# Patient Record
Sex: Female | Born: 1955
Health system: Southern US, Community
[De-identification: ages and names within clinical notes are randomized; demographics above are authoritative.]

## PROBLEM LIST (undated history)

## (undated) DIAGNOSIS — K219 Gastro-esophageal reflux disease without esophagitis: Secondary | ICD-10-CM

## (undated) DIAGNOSIS — F419 Anxiety disorder, unspecified: Secondary | ICD-10-CM

## (undated) DIAGNOSIS — E118 Type 2 diabetes mellitus with unspecified complications: Secondary | ICD-10-CM

## (undated) DIAGNOSIS — F32A Depression, unspecified: Secondary | ICD-10-CM

## (undated) DIAGNOSIS — M87051 Idiopathic aseptic necrosis of right femur: Secondary | ICD-10-CM

## (undated) DIAGNOSIS — M87059 Idiopathic aseptic necrosis of unspecified femur: Secondary | ICD-10-CM

## (undated) DIAGNOSIS — E782 Mixed hyperlipidemia: Secondary | ICD-10-CM

## (undated) DIAGNOSIS — I10 Essential (primary) hypertension: Secondary | ICD-10-CM

## (undated) DIAGNOSIS — J45909 Unspecified asthma, uncomplicated: Secondary | ICD-10-CM

## (undated) DIAGNOSIS — E559 Vitamin D deficiency, unspecified: Secondary | ICD-10-CM

## (undated) DIAGNOSIS — F329 Major depressive disorder, single episode, unspecified: Secondary | ICD-10-CM

## (undated) HISTORY — PX: INCONTINENCE SURGERY: SHX676

## (undated) HISTORY — DX: Vitamin D deficiency, unspecified: E55.9

## (undated) HISTORY — DX: Mixed hyperlipidemia: E78.2

## (undated) HISTORY — DX: Gastro-esophageal reflux disease without esophagitis: K21.9

## (undated) HISTORY — DX: Type 2 diabetes mellitus with unspecified complications: E11.8

## (undated) HISTORY — PX: SHOULDER SURGERY: SHX246

## (undated) HISTORY — DX: Idiopathic aseptic necrosis of unspecified femur: M87.059

## (undated) HISTORY — DX: Idiopathic aseptic necrosis of right femur: M87.051

## (undated) HISTORY — PX: FOOT SURGERY: SHX648

## (undated) HISTORY — PX: TONSILLECTOMY: SUR1361

## (undated) HISTORY — DX: Anxiety disorder, unspecified: F41.9

## (undated) HISTORY — DX: Depression, unspecified: F32.A

## (undated) HISTORY — DX: Major depressive disorder, single episode, unspecified: F32.9

---

## 1997-11-03 ENCOUNTER — Ambulatory Visit (HOSPITAL_COMMUNITY): Admission: RE | Admit: 1997-11-03 | Discharge: 1997-11-03 | Payer: Self-pay | Admitting: Family Medicine

## 1999-11-07 ENCOUNTER — Ambulatory Visit (HOSPITAL_COMMUNITY): Admission: RE | Admit: 1999-11-07 | Discharge: 1999-11-07 | Payer: Self-pay | Admitting: Family Medicine

## 1999-11-07 ENCOUNTER — Encounter: Payer: Self-pay | Admitting: Family Medicine

## 2001-01-22 ENCOUNTER — Encounter: Payer: Self-pay | Admitting: Family Medicine

## 2001-01-22 ENCOUNTER — Ambulatory Visit (HOSPITAL_COMMUNITY): Admission: RE | Admit: 2001-01-22 | Discharge: 2001-01-22 | Payer: Self-pay | Admitting: Family Medicine

## 2002-02-03 ENCOUNTER — Ambulatory Visit (HOSPITAL_COMMUNITY): Admission: RE | Admit: 2002-02-03 | Discharge: 2002-02-03 | Payer: Self-pay | Admitting: Obstetrics & Gynecology

## 2002-02-03 ENCOUNTER — Encounter: Payer: Self-pay | Admitting: Family Medicine

## 2003-02-08 ENCOUNTER — Encounter: Payer: Self-pay | Admitting: Family Medicine

## 2003-02-08 ENCOUNTER — Ambulatory Visit (HOSPITAL_COMMUNITY): Admission: RE | Admit: 2003-02-08 | Discharge: 2003-02-08 | Payer: Self-pay | Admitting: Family Medicine

## 2006-01-27 ENCOUNTER — Ambulatory Visit (HOSPITAL_COMMUNITY): Admission: RE | Admit: 2006-01-27 | Discharge: 2006-01-27 | Payer: Self-pay | Admitting: Obstetrics & Gynecology

## 2006-06-30 ENCOUNTER — Ambulatory Visit (HOSPITAL_COMMUNITY): Admission: RE | Admit: 2006-06-30 | Discharge: 2006-07-01 | Payer: Self-pay | Admitting: Urology

## 2007-01-29 ENCOUNTER — Ambulatory Visit (HOSPITAL_COMMUNITY): Admission: RE | Admit: 2007-01-29 | Discharge: 2007-01-29 | Payer: Self-pay | Admitting: Family Medicine

## 2007-07-16 ENCOUNTER — Ambulatory Visit (HOSPITAL_BASED_OUTPATIENT_CLINIC_OR_DEPARTMENT_OTHER): Admission: RE | Admit: 2007-07-16 | Discharge: 2007-07-16 | Payer: Self-pay | Admitting: Orthopedic Surgery

## 2007-11-10 ENCOUNTER — Other Ambulatory Visit: Admission: RE | Admit: 2007-11-10 | Discharge: 2007-11-10 | Payer: Self-pay | Admitting: Family Medicine

## 2010-01-30 ENCOUNTER — Emergency Department (HOSPITAL_COMMUNITY): Admission: EM | Admit: 2010-01-30 | Discharge: 2010-01-30 | Payer: Self-pay | Admitting: Emergency Medicine

## 2010-05-12 ENCOUNTER — Encounter: Payer: Self-pay | Admitting: Family Medicine

## 2010-05-13 ENCOUNTER — Encounter: Payer: Self-pay | Admitting: Family Medicine

## 2010-07-05 LAB — BASIC METABOLIC PANEL
BUN: 8 mg/dL (ref 6–23)
CO2: 27 mEq/L (ref 19–32)
Chloride: 103 mEq/L (ref 96–112)
GFR calc Af Amer: >60 mL/min (ref >60)
GFR calc non Af Amer: >60 mL/min (ref >60)
Potassium: 3.7 mEq/L (ref 3.5–5.1)
Sodium: 138 mEq/L (ref 135–145)

## 2010-07-05 LAB — CBC: Hemoglobin: 13 g/dL (ref 12.0–15.0)

## 2010-09-04 NOTE — Op Note (Signed)
[name], [name]                ACCOUNT NO.:  [id number]   MEDICAL RECORD NO.:  [id number]          PATIENT TYPE:  AMB   LOCATION:  DSC                          FACILITY:  [facility]   PHYSICIAN:  Feliberto Gottron. Turner Daniels, M.D.   DATE OF BIRTH:  [birth year]   DATE OF PROCEDURE:  07/16/2007  DATE OF DISCHARGE:                               OPERATIVE REPORT   PREOPERATIVE DIAGNOSIS:  Left shoulder impingement syndrome,  acromioclavicular joint arthritis, possible rotator cuff tear.   POSTOPERATIVE DIAGNOSIS:  Left shoulder impingement syndrome,  acromioclavicular joint arthritis with subclavicular spur and a massive  rotator cuff tear.   PROCEDURE:  Left shoulder arthroscopic anterior-inferior acromioplasty,  distal clavicle spur excision, and debridement of massive rotator cuff  tear.   SURGEON:  Feliberto Gottron.  Turner Daniels, M.D.   FIRST ASSISTANT:  Skip Mayer, P.A.-C.   ANESTHETIC:  Interscalene block right and general endotracheal.   FLUID REPLACEMENT:  800 mL of crystalloid.   DRAINS PLACED:  None.   TOURNIQUET TIME:  None.   INDICATIONS FOR PROCEDURE:  A 55 year old woman with fairly impressive  right shoulder impingement syndrome, type 2 subacromial spur who has  failed conservative treatment and desires elective arthroscopic  evaluation and treatment of her right shoulder.  She is a two pack a day  smoker, is mildly overweight, and is aware that if she has a repairable  rotator cuff tear we will do that; if she has a massive cuff tear  especially in light of the tobacco use we will not attempt repair it for  two reasons:  One, it cannot be repaired, and secondly, her healing  potential is minimized by the age and tobacco use.  Risks and benefits  of surgery discussed, questions answered.   DESCRIPTION OF PROCEDURE:  The patient identified by armband, taken to  the block area at [facility] day surgery center, and right interscalene  block was induced.  She was then taken to operating Room  1, appropriate  anesthetic monitors were attached, and general endotracheal anesthesia  induced and then placed in the beach-chair position  __________  positioner.  Right upper extremity prepped and draped in usual sterile  fashion from the wrist to the hemithorax.  Using a #11 blade, standard  portals were then made 1.5-cm anterior to the [facility] joint, lateral to the  junction of middle and posterior thirds of the acromion, and posterior  to the posterolateral corner of the acromion process.  Inflow was placed  anteriorly, the arthroscope laterally, and a 4.2 [place]  shaver posteriorly.  Subacromial bursectomy was accomplished revealing a  massive rotator cuff tear of the supraspinatus tendon, and we debrided  it back to stable tissue, back to the glenoid rim.  We made a  supplemental anterolateral portal to put a grasper into the shoulder,  and we were only able to get the edge of the rotator cuff to within  about a centimeter and half of the medial border of the greater  tuberosity making any sort of repair pretty much unlikely.  We then  debrided the anterior and  posterior edges back to a stable margin, and  it pretty much involved most of the supraspinatus tendon.  The biceps  tendon and biceps anchor were intact.  The labrum was intact.  I  directed our attention to the subacromial region.  We identified  subclavicular and subacromial spurs, and using a 4.5 hooded vortex bur  created a type 1 subacromial arch removing the subclavicular and  subacromial spurs without difficulty.  Small bleeders were cauterized  with the hook Bovie.  The shoulder was then irrigated out with normal  saline solution and the arthroscopic instruments removed.  A dressing of  Xeroform, 4x4 dressing, sponges, paper tape, and a sling applied.  The  patient was laid supine, awakened, and taken to the recovery room  without difficulty.      Feliberto Gottron. Turner Daniels, M.D.  Electronically Signed      FJR/MEDQ  D:  07/16/2007  T:  07/17/2007  Job:  [id number]

## 2010-09-07 NOTE — Op Note (Signed)
[name], [name]                ACCOUNT NO.:  [id number]   MEDICAL RECORD NO.:  [id number]          PATIENT TYPE:  AMB   LOCATION:  DAY                          FACILITY:  [facility]   PHYSICIAN:  Maretta Bees. Vonita Moss, M.D.DATE OF BIRTH:  1956/04/07   DATE OF PROCEDURE:  06/30/2006  DATE OF DISCHARGE:                               OPERATIVE REPORT   PREOPERATIVE DIAGNOSIS:  Stress urinary incontinence.   POSTOP DIAGNOSIS:  Stress urinary incontinence.   PROCEDURE:  Obturator sling insertion and cystoscopy.   SURGEON:  Dr. Larey Dresser   ANESTHESIA:  Spinal   INDICATIONS:  This lady has had significant stress urinary incontinence  for 3 years.  She seemed to have minimal urgency incontinence.  On  examination she has very mild cystocele and no rectocele.   Urodynamics showed a stable bladder that was somewhat high capacity and  only had a 2-3 cm descensus on fluoroscopic exam.  She was felt to be a  satisfactory candidate for obturator sling insertion and has a slightly  increased risk of urinary retention, but still probably low at 2-3%.  She was advised about the recovery time an overnight stay and risk of  erosion, infection, retention or failure to correct her incontinence.  She is also advised about risk of bladder injury.   PROCEDURE:  The patient brought to the operating room and placed in  lithotomy position after induction of spinal anesthesia.  She was  prepped and draped in the usual fashion including vaginal canal.  She  seemed to have a good deal of pelvic relaxation and certainly larger  cystocele than urodynamics or clinical exam indicated. I called Dr.  McDiarmid who said that pelvic floor relaxation can be exaggerated under  spinal as I suspected.  He advised just making sure the sling was placed  in mid urethra and did not advocate cystocele repair at this time.  Also  make sure the sling was not too tight.   Foley catheter was inserted and the bladder  drained. The suburethral  vaginal wall was infiltrated with Xylocaine with epi.  A mid urethral  midline incision was made suburethrally and dissection was carried  periurethrally to the endopelvic fascia on each side.  I should add  before I made the vaginal incision I used a marker to indicate the site  of the Foley balloon when it was pulled on traction so I knew where the  proximal urethra was to place a sling in mid urethra. Stab wounds were  made over the obturator fossa area bilaterally at the same height as the  clitoris.  The curved needle passers were placed through the stab wound  and marched along the back of the obturator bone to exit the endopelvic  fascia lateral to the urethra with no button holing.  With both needles  in good position the obturator sling was pulled into position and then I  cystoscoped her. There was no evidence of bladder or urethral injury.  The sling was then placed in mid urethra with a hemostat easily fitting  between the sling and the urethra.  There was not felt to be any excess  tension and the sling was mid urethra.  Excess sling material was cut  off at the stab wound entry sites.  Wounds were irrigated with  antibiotic solution.  The vaginal incision was closed with running 2-0  Vicryl and after the cystoscopy the Foley catheter was back in position.  The stab wounds were dressed with Dermabond.  An Estrace cream  impregnated pack was placed in the vagina.  Foley catheter was connected  to closed drainage.  The patient was taken to recovery room in good  condition having tolerated the procedure well.      Maretta Bees. Vonita Moss, M.D.  Electronically Signed     LJP/MEDQ  D:  06/30/2006  T:  06/30/2006  Job:  [id number]

## 2011-01-14 LAB — BASIC METABOLIC PANEL
BUN: 11
CO2: 25
Calcium: 9.5
Chloride: 95 — ABNORMAL LOW
Creatinine, Ser: 0.47
Potassium: 4.8

## 2011-01-14 LAB — POCT HEMOGLOBIN-HEMACUE: Hemoglobin: 11.3 — ABNORMAL LOW

## 2011-12-20 ENCOUNTER — Emergency Department (HOSPITAL_COMMUNITY)
Admission: EM | Admit: 2011-12-20 | Discharge: 2011-12-20 | Disposition: A | Discharge status: Home / Self Care | Payer: Self-pay | Attending: Emergency Medicine | Admitting: Emergency Medicine

## 2011-12-20 ENCOUNTER — Emergency Department (HOSPITAL_COMMUNITY): Payer: Self-pay

## 2011-12-20 ENCOUNTER — Encounter (HOSPITAL_COMMUNITY): Payer: Self-pay | Admitting: *Deleted

## 2011-12-20 DIAGNOSIS — K5732 Diverticulitis of large intestine without perforation or abscess without bleeding: Secondary | ICD-10-CM | POA: Insufficient documentation

## 2011-12-20 DIAGNOSIS — F329 Major depressive disorder, single episode, unspecified: Secondary | ICD-10-CM | POA: Insufficient documentation

## 2011-12-20 DIAGNOSIS — F3289 Other specified depressive episodes: Secondary | ICD-10-CM | POA: Insufficient documentation

## 2011-12-20 DIAGNOSIS — J45909 Unspecified asthma, uncomplicated: Secondary | ICD-10-CM | POA: Insufficient documentation

## 2011-12-20 DIAGNOSIS — F411 Generalized anxiety disorder: Secondary | ICD-10-CM | POA: Insufficient documentation

## 2011-12-20 DIAGNOSIS — I10 Essential (primary) hypertension: Secondary | ICD-10-CM | POA: Insufficient documentation

## 2011-12-20 DIAGNOSIS — E119 Type 2 diabetes mellitus without complications: Secondary | ICD-10-CM | POA: Insufficient documentation

## 2011-12-20 DIAGNOSIS — F17200 Nicotine dependence, unspecified, uncomplicated: Secondary | ICD-10-CM | POA: Insufficient documentation

## 2011-12-20 DIAGNOSIS — R109 Unspecified abdominal pain: Secondary | ICD-10-CM | POA: Insufficient documentation

## 2011-12-20 DIAGNOSIS — Z79899 Other long term (current) drug therapy: Secondary | ICD-10-CM | POA: Insufficient documentation

## 2011-12-20 HISTORY — DX: Unspecified asthma, uncomplicated: J45.909

## 2011-12-20 HISTORY — DX: Essential (primary) hypertension: I10

## 2011-12-20 LAB — CBC WITH DIFFERENTIAL/PLATELET
Basophils Absolute: 0 10*3/uL (ref 0.0–0.1)
Basophils Relative: 0 % (ref 0–1)
Eosinophils Absolute: 0.2 10*3/uL (ref 0.0–0.7)
Eosinophils Relative: 2 % (ref 0–5)
HCT: 34.1 % — ABNORMAL LOW (ref 36.0–46.0)
Hemoglobin: 11.8 g/dL — ABNORMAL LOW (ref 12.0–15.0)
Lymphocytes Relative: 21 % (ref 12–46)
MCHC: 34.6 g/dL (ref 30.0–36.0)
MCV: 86.1 fL (ref 78.0–100.0)
Neutrophils Relative %: 68 % (ref 43–77)
Platelets: 245 10*3/uL (ref 150–400)
RBC: 3.96 MIL/uL (ref 3.87–5.11)
RDW: 12.5 % (ref 11.5–15.5)
WBC: 10.3 10*3/uL (ref 4.0–10.5)

## 2011-12-20 LAB — COMPREHENSIVE METABOLIC PANEL
ALT: 18 U/L (ref 0–35)
Albumin: 3.9 g/dL (ref 3.5–5.2)
Alkaline Phosphatase: 76 U/L (ref 39–117)
CO2: 25 mEq/L (ref 19–32)
GFR calc Af Amer: >90 mL/min (ref >90)
Glucose, Bld: 109 mg/dL — ABNORMAL HIGH (ref 70–99)
Sodium: 135 mEq/L (ref 135–145)
Total Protein: 7.1 g/dL (ref 6.0–8.3)

## 2011-12-20 LAB — URINALYSIS, ROUTINE W REFLEX MICROSCOPIC
Specific Gravity, Urine: 1.009 (ref 1.005–1.030)
pH: 6.5 (ref 5.0–8.0)

## 2011-12-20 MED ORDER — IOHEXOL 300 MG/ML  SOLN
100.0000 mL | Freq: Once | INTRAMUSCULAR | Status: AC | PRN
  Administered 2011-12-20: 100 mL via INTRAVENOUS

## 2011-12-20 MED ORDER — ONDANSETRON HCL 4 MG/2ML IJ SOLN
4.0000 mg | Freq: Once | INTRAMUSCULAR | Status: AC
  Administered 2011-12-20: 4 mg via INTRAVENOUS
  Filled 2011-12-20: qty 2

## 2011-12-20 MED ORDER — ONDANSETRON 4 MG PO TBDP: 4.0000 mg | ORAL_TABLET | Freq: Once | ORAL | Status: DC

## 2011-12-20 MED ORDER — SODIUM CHLORIDE 0.9 % IV SOLN
INTRAVENOUS | Status: DC
  Administered 2011-12-20: 08:00:00 via INTRAVENOUS

## 2011-12-20 MED ORDER — IBUPROFEN 800 MG PO TABS: 800.0000 mg | ORAL_TABLET | Freq: Once | ORAL | Status: DC

## 2011-12-20 MED ORDER — TRAMADOL HCL 50 MG PO TABS: 50.0000 mg | ORAL_TABLET | Freq: Four times a day (QID) | ORAL | Status: AC | PRN

## 2011-12-20 MED ORDER — METRONIDAZOLE 500 MG PO TABS: 500.0000 mg | ORAL_TABLET | Freq: Three times a day (TID) | ORAL | Status: AC

## 2011-12-20 MED ORDER — CIPROFLOXACIN HCL 500 MG PO TABS: 500.0000 mg | ORAL_TABLET | Freq: Two times a day (BID) | ORAL | Status: AC

## 2011-12-20 MED ORDER — SODIUM CHLORIDE 0.9 % IV BOLUS (SEPSIS)
500.0000 mL | Freq: Once | INTRAVENOUS | Status: AC
  Administered 2011-12-20: 500 mL via INTRAVENOUS

## 2011-12-20 NOTE — ED Provider Notes (Signed)
History     CSN: [id number]  Arrival date & time 12/20/11  4098   First MD Initiated Contact with Patient 12/20/11 0710      Chief Complaint  Patient presents with  . Abdominal Pain    (Consider location/radiation/quality/duration/timing/severity/associated sxs/prior treatment) HPI Pt is a 56 yo female complaining of 5 days of suprapubic abdominal pain and urinary frequency. Pt reports that 5 days ago, she started noticing some suprapubic abdominal pain and distension. She had a couple of loose stools early in the week resolved after a couple of days.  Four days ago, she started having urinary urgency and frequency. She has had to urinate approx 20x/day since then. She denies dysuria, hesitancy, or sensation of incomplete bladder evacuation. She previously had urethral sling procedure in 2008 for stress incontinence. She has never had UTI in the past.  Over the past 2 days, she has also noticed symptoms of rectal pressure, urge to defecate, and inability to produce stool. This occurs 2-3 times per day. Over the pas 2 days, she has had a daily BM w stool firmer than usual but otherwise normal. Last BM was this morning. She has not noticed blood in her stool, dark, tarry stool or change in character of her stool. She denies fever, chills, back pain, weight loss, night sweats, dizziness, weakness, vomiting, chest pain, shortness of breath, recent URI symptoms. She has never had this episode in the past. She usually has normal BM.  She has never had a screening colonoscopy due to financial issues.   Past Medical History  Diagnosis Date  . Diabetes mellitus   . Hypertension   . Depression   . Anxiety   . Asthma     related to seasonal allergies    Past Surgical History  Procedure Date  . Incontinence surgery   . Shoulder surgery     No family history on file.  History  Substance Use Topics  . Smoking status: Current Everyday Smoker    Types: Cigarettes  . Smokeless tobacco:  Never Used  . Alcohol Use: Yes    OB History    Grav Para Term Preterm Abortions TAB SAB Ect Mult Living                  Review of Systems 10 Systems reviewed and are negative for acute change except as noted in the HPI.  Allergies  Penicillins  Home Medications   Current Outpatient Rx  Name Route Sig Dispense Refill  . ALBUTEROL SULFATE HFA 108 (90 BASE) MCG/ACT IN AERS Inhalation Inhale 2 puffs into the lungs every 6 (six) hours as needed.    . ALPRAZOLAM 1 MG PO TABS Oral Take 1 mg by mouth 3 (three) times daily as needed. Anxiety    . AMLODIPINE BESYLATE 10 MG PO TABS Oral Take 10 mg by mouth daily.    . ATENOLOL 50 MG PO TABS Oral Take 50 mg by mouth daily.    Marland Kitchen VITAMIN B-12 2500 MCG SL SUBL Sublingual Place under the tongue daily.    Marland Kitchen FLUOXETINE HCL 20 MG PO CAPS Oral Take 20 mg by mouth daily.    Marland Kitchen METFORMIN HCL 1000 MG PO TABS Oral Take 1,000 mg by mouth 2 (two) times daily with a meal.    . SERTRALINE HCL 50 MG PO TABS Oral Take 25 mg by mouth daily.    Marland Kitchen CIPROFLOXACIN HCL 500 MG PO TABS Oral Take 1 tablet (500 mg total) by mouth 2 (two)  times daily. One by mouth twice daily for 14 days 28 tablet 0  . METRONIDAZOLE 500 MG PO TABS Oral Take 1 tablet (500 mg total) by mouth 3 (three) times daily. One po bid x 7 days 42 tablet 0  . TRAMADOL HCL 50 MG PO TABS Oral Take 1 tablet (50 mg total) by mouth every 6 (six) hours as needed for pain. 15 tablet 0    BP 130/74  Pulse 85  Temp 97.6 F (36.4 C) (Oral)  Resp 24  Ht 5\' 10"  (1.778 m)  Wt 192 lb (87.091 kg)  BMI 27.55 kg/m2  SpO2 99%  Physical Exam  Constitutional: She is oriented to person, place, and time. She appears well-developed and well-nourished. No distress.  HENT:  Head: Normocephalic and atraumatic.  Mouth/Throat: Oropharynx is clear and moist.  Eyes: Conjunctivae and EOM are normal. Pupils are equal, round, and reactive to light. No scleral icterus.  Neck: Normal range of motion. Neck supple. No JVD  present. No tracheal deviation present. No thyromegaly present.  Cardiovascular: Normal rate, regular rhythm, normal heart sounds and intact distal pulses.  Exam reveals no gallop and no friction rub.   No murmur heard. Pulmonary/Chest: Effort normal and breath sounds normal.  Abdominal: Soft. Bowel sounds are normal. She exhibits no distension. There is tenderness. There is no rebound and no guarding.       Suprapubic and LLQ tenderness to palpation.  Genitourinary:       Normal rectal tone, no hemorrhoids apparent, rectal vault empty, no blood visible.  Neurological: She is alert and oriented to person, place, and time. No cranial nerve deficit.  Skin: Skin is warm and dry. No rash noted. She is not diaphoretic.    ED Course  Procedures (including critical care time)  Labs Reviewed  CBC WITH DIFFERENTIAL - Abnormal; Notable for the following:    Hemoglobin 11.8 (*)     HCT 34.1 (*)     All other components within normal limits  COMPREHENSIVE METABOLIC PANEL - Abnormal; Notable for the following:    Glucose, Bld 109 (*)     Total Bilirubin 0.2 (*)     All other components within normal limits  URINALYSIS, ROUTINE W REFLEX MICROSCOPIC   Ct Abdomen Pelvis W Contrast  12/20/2011  *RADIOLOGY REPORT*  Clinical Data: Lower abdominal pain.  CT ABDOMEN AND PELVIS WITH CONTRAST  Technique:  Multidetector CT imaging of the abdomen and pelvis was performed following the standard protocol during bolus administration of intravenous contrast.  Contrast: OMNIPAQUE IOHEXOL 300 MG/ML  SOLN  Comparison: None  Findings: The liver, spleen, pancreas, and adrenal glands appear unremarkable.  The gallbladder and biliary system appear unremarkable.  No pathologic retroperitoneal or porta hepatis adenopathy is identified.  The kidneys appear unremarkable, as do the proximal ureters.  Aortoiliac atherosclerotic calcification noted. No pathologic pelvic adenopathy is identified.  Cecum is mildly high in  position, in the right mid abdomen, with several metallic densities along its margin suggesting prior appendectomy.  Sigmoid diverticulosis is present with moderate active diverticulitis in the midsigmoid colon as shown on image 79 of series 6.  No diverticular abscess or definite extraluminal gas observed.  Urinary bladder mildly distended but otherwise unremarkable.  Degenerative disc disease with vacuum disc phenomenon noted at L4- 5, potentially causing mild central stenosis.  There is evidence of bilateral hip avascular necrosis, without contour abnormality of the femoral heads.  IMPRESSION:  1.  Moderate acute sigmoid colon diverticulitis, without abscess or  extraluminal gas. 2.  Bilateral hip avascular necrosis, without contour abnormality of the femoral heads. 3.  Atherosclerosis. 4.  Possible mild central stenosis at L4-5 due to degenerative disc disease.   Original Report Authenticated By: Dellia Cloud, M.D.      1. Diverticulitis of sigmoid colon       MDM  1. Abd pain/urinary urgency Suspect UTI vs diverticulitis. Pt has tenesmus, which could be related to cystitis, but might also suggest diverticulitis.  No CVA tenderness, abdomen is benign, pt non-toxic in appearance. -UA w micro, FOBT, istat chem 8, CT abdomen Bronson Curb 12/20/2011 8:30AM  CT shows multiple inflamed sigmoid diverticulae with no abscess or peritoneal fluid. Urine clear. Good appetite, tolerating po intake. Will treat pt for uncomplicated diverticulitis w cipro and flagyl for 14 days. Counseled on alcohol abstinence while on flagyl, pt agreed.  CT scan w incidental finding bilateral AVN of femoral heads. Pt says she has had chronic hip pain for some time. Recommended following-up w PCP as she may need ortho referral, pt expressed understanding and agreed.  Instructed pt to hold metformin for 48h after CT, check BG at home, call PCP if >300. Pt agreed.  Bronson Curb 12/20/2011 11:05 AM   Bronson Curb,  MD 12/20/11 [phone]

## 2011-12-20 NOTE — ED Notes (Signed)
Pt c/o lower abdominal pain for 5 days. Pt reports a lot of pressure on rectum. Last BM was this am, soft. Yesterday pt had two hard BM. Pt reports nausea this morning. Pt denies dysuria but an increase frequency in urination.

## 2011-12-20 NOTE — ED Notes (Signed)
Patient given contrast by CT staff- patient drinking and tolerating well.

## 2011-12-20 NOTE — Discharge Instructions (Signed)
1. Please take two antibiotics for a total of 14 days. You will take ciprofloxacin 500mg  twice daily and metronidazole 500mg  three times daily. You cannot drink alcohol while taking metronidazole, or you could have a bad reaction. 2. Please call your primary care doctor to follow-up in about 2 weeks. You will need to discuss management of diverticulitis. You will probably need colonoscopy in 4-6 weeks. You should also ask your doctor if you need any further work-up of your hip joints, as your CT scan showed avascular necrosis of hip joints.  3. Please follow a low-fiber diet (described below) and return to the ED if you have any warning symptoms (described below). 4. You can take tramadol for pain for a few days, and then tylenol or ibuprofen. 5. Do not take metformin for 48 hours after your CT scan today. Diverticulitis Small pockets or "bubbles" can develop in the wall of the intestine. Diverticulitis is when those pockets become infected and inflamed. This causes stomach pain (usually on the left side). HOME CARE  Take all medicine as told by your doctor.   Try a clear liquid diet (broth, tea, or water) for as long as told by your doctor.   Keep all follow-up visits with your doctor.   You may be put on a low-fiber diet once you start feeling better. Here are foods that have low-fiber:   White breads, cereals, rice, and pasta.   Cooked fruits and vegetables or soft fresh fruits and vegetables without the skin.   Ground or well-cooked tender beef, ham, veal, lamb, pork, or poultry.   Eggs and seafood.   After you are doing well on the low-fiber diet, you may be put on a high-fiber diet. Here are ways to increase your fiber:   Choose whole-grain breads, cereals, pasta, and brown rice.   Choose fruits and vegetables with skin on. Do not overcook the vegetables.   Choose nuts, seeds, legumes, dried peas, beans, and lentils.   Look for food products that have more than 3 grams of fiber  per serving on the food label.  GET HELP RIGHT AWAY IF:  Your pain does not get better or gets worse.   You have trouble eating food.   You are not pooping (having bowel movements) like normal.   You have a temperature by mouth above 102 F (38.9 C), not controlled by medicine.   You keep throwing up (vomiting).   You have bloody or black, tarry poop (stools).   You are getting worse and not better.  MAKE SURE YOU:   Understand these instructions.   Will watch your condition.   Will get help right away if you are not doing well or get worse.  Document Released: 09/25/2007 Document Revised: 03/28/2011 Document Reviewed: 02/27/2009 [facility] Patient Information 2012 [place], [place].  Low Fiber and Residue Restricted Diet A low fiber diet restricts foods that contain carbohydrates that are not digested in the small intestine. A diet containing about 10 g of fiber is considered low fiber. The diet needs to be individualized to suit patient tolerances and preferences and to avoid unnecessary restrictions. Generally, the foods emphasized in a low fiber diet have no skins or seeds. They may have been processed to remove bran, germ, or husks. Cooking may not necessarily eliminate the fiber. Cooking may, in fact, enable a greater quantity of fiber to be consumed in a lesser volume. Legumes and nuts are also restricted. The term low residue has also been used to describe  low fiber diets, although the two are not the same. Residue refers to any substance that adds to bowel (colonic) contents, such as sloughed cells and intestinal bacteria, in addition to fiber. Residue-containing foods, prunes and prune juice, milk, and connective tissue from meats may also need to be eliminated. It is important to eliminate these foods during sudden (acute) attacks of inflammatory bowel disease, when there is a partial obstruction due to another reason, or when minimal fecal output is desired. When these problems  are gone, a more normal diet may be used. PURPOSE  Prevent blockage of a partially obstructed or narrowed gastrointestinal tract.   Reduce stool weight and volume.   Slow the movement of waste.  WHEN IS THIS DIET USED?  Acute phase of Crohn's disease, ulcerative colitis, regional enteritis, or diverticulitis.   Narrowing (stenosis) of intestinal or esophageal tubes (lumina).   Transitional diet following surgery, injury (trauma), or illness.  ADEQUACY This diet is nutritionally adequate based on individual food choices according to the Recommended Dietary Allowances of the [employer]. CHOOSING FOODS Check labels, especially on foods from the starch list. Often, dietary fiber content is listed with the Nutrition Facts panel.  Breads and Starches  Allowed: White, [place], and pita breads, plain rolls, buns, or sweet rolls, doughnuts, waffles, pancakes, bagels. Plain muffins, sweet breads, biscuits, matzoth. Flour. Soda, saltine, or graham crackers. Pretzels, rusks, melba toast, zwieback. Cooked cereals: cornmeal, farina, cream cereals. Dry cereals: refined corn, wheat, rice, and oat cereals (check label). Potatoes prepared any way without skins, refined macaroni, spaghetti, noodles, refined rice.   Avoid: Bread, rolls, or crackers made with whole-wheat, multigrains, rye, bran seeds, nuts, or coconut. Corn tortillas, table-shells. Corn chips, tortilla chips. Cereals containing whole-grains, multigrains, bran, coconut, nuts, or raisins. Cooked or dry oatmeal. Coarse wheat cereals, granola. Cereals advertised as "high fiber." Potato skins. Whole-grain pasta, wild or brown rice. Popcorn.  Vegetables  Allowed:  Strained tomato and vegetable juices. Fresh: tender lettuce, cucumber, cabbage, spinach, bean sprouts. Cooked, canned: asparagus, bean sprouts, cut green or wax beans, cauliflower, pumpkin, beets, mushrooms, olives, spinach, yellow squash, tomato, tomato sauce (no seeds),  zucchini (peeled), turnips. Canned sweet potatoes. Small amounts of celery, onion, radish, and green pepper may be used. Keep servings limited to  cup.   Avoid: Fresh, cooked, or canned: artichokes, baked beans, beet greens, broccoli, Brussels sprouts, French-style green beans, corn, kale, legumes, peas, sweet potatoes. Cooked: green or red cabbage, spinach. Avoid large servings of any vegetables.  Fruit  Allowed:  All fruit juices except prune juice. Cooked or canned: apricots applesauce, cantaloupe, cherries, grapefruit, grapes, kiwi, mandarin oranges, peaches, pears, fruit cocktail, pineapple, plums, watermelon. Fresh: banana, grapes, cantaloupe, avocado, cherries, pineapple, grapefruit, kiwi, nectarines, peaches, oranges, blueberries, plums. Keep servings limited to  cup or 1 piece.   Avoid: Fresh: apple with or without skin, apricots, mango, pears, raspberries, strawberries. Prune juice, stewed or dried prunes. Dried fruits, raisins, dates. Avoid large servings of all fresh fruits.  Meat and Meat Substitutes  Allowed:  Ground or well-cooked tender beef, ham, veal, lamb, pork, or poultry. Eggs, plain cheese. Fish, oysters, shrimp, lobster, other seafood. Liver, organ meats.   Avoid: Tough, fibrous meats with gristle. Peanut butter, smooth or chunky. Cheese with seeds, nuts, or other foods not allowed. Nuts, seeds, legumes, dried peas, beans, lentils.  Milk  Allowed:  All milk products except those not allowed. Milk and milk product consumption should be minimal when low residue is desired.   Avoid:  Yogurt that contains nuts or seeds.  Soups and Combination Foods  Allowed:  Bouillon, broth, or cream soups made from allowed foods. Any strained soup. Casseroles or mixed dishes made with allowed foods.   Avoid: Soups made from vegetables that are not allowed or that contain other foods not allowed.  Desserts and Sweets  Allowed:  Plain cakes and cookies, pie made with allowed fruit,  pudding, custard, cream pie. Gelatin, fruit, ice, sherbet, frozen ice pops. Ice cream, ice milk without nuts. Plain hard candy, honey, jelly, molasses, syrup, sugar, chocolate syrup, gumdrops, marshmallows.   Avoid: Desserts, cookies, or candies that contain nuts, peanut butter, or dried fruits. Jams, preserves with seeds, marmalade.  Fats and Oils  Allowed:  Margarine, butter, cream, mayonnaise, salad oils, plain salad dressings made from allowed foods. Plain gravy, crisp bacon without rind.   Avoid: Seeds, nuts, olives. Avocados.  Beverages  Allowed:  All, except those listed to avoid.   Avoid: Fruit juices with high pulp, prune juice.  Condiments  Allowed:  Ketchup, mustard, horseradish, vinegar, cream sauce, cheese sauce, cocoa powder. Spices in moderation: allspice, basil, bay leaves, celery powder or leaves, cinnamon, cumin powder, curry powder, ginger, mace, marjoram, onion or garlic powder, oregano, paprika, parsley flakes, ground pepper, rosemary, sage, savory, tarragon, thyme, turmeric.   Avoid: Coconut, pickles.  SAMPLE MEAL PLAN The following menu is provided as a sample. Your daily menu plans will vary. Be sure to include a minimum of the following each day in order to provide essential nutrients for the adult:  Starch/Bread/Cereal Group, 6 servings.   Fruit/Vegetable Group, 5 servings.   Meat/Meat Substitute Group, 2 servings.   Milk/Milk Substitute Group, 2 servings.  A serving is equal to  cup for fruits, vegetables, and cooked cereals or 1 piece for foods such as a piece of bread, 1 orange, or 1 apple. For dry cereals and crackers, use serving sizes listed on the label. Combination foods may count as full or partial servings from various food groups. Fats, desserts, and sweets may be added to the meal plan after the requirements for essential nutrients are met. SAMPLE MENU Breakfast   cup orange juice.   1 boiled egg.   1 slice white toast.   Margarine.     cup cornflakes.   1 cup milk.   Beverage.  Lunch   cup chicken noodle soup.   2 to 3 oz sliced roast beef.   2 slices seedless rye bread.   Mayonnaise.    cup tomato juice.   1 small banana.   Beverage.  Dinner  3 oz baked chicken.    cup scalloped potatoes.    cup cooked beets.   White dinner roll.   Margarine.    cup canned peaches.   Beverage.  Document Released: 09/28/2001 Document Revised: 03/28/2011 Document Reviewed: 03/11/2011 [facility] Patient Information 2012 [place], [place].

## 2011-12-21 NOTE — ED Provider Notes (Signed)
Medical screening examination/treatment/procedure(s) were performed by non-physician practitioner and as supervising physician I was immediately available for consultation/collaboration.  Juliet Rude. Rubin Payor, MD 12/21/11 [phone]

## 2011-12-23 LAB — OCCULT BLOOD, POC DEVICE: Fecal Occult Bld: NEGATIVE

## 2012-11-03 ENCOUNTER — Ambulatory Visit: Payer: No Typology Code available for payment source | Admitting: Neurology

## 2012-11-10 ENCOUNTER — Ambulatory Visit: Payer: No Typology Code available for payment source | Admitting: Neurology

## 2012-12-02 ENCOUNTER — Other Ambulatory Visit (HOSPITAL_COMMUNITY): Payer: Self-pay | Admitting: Family Medicine

## 2012-12-02 DIAGNOSIS — Z1231 Encounter for screening mammogram for malignant neoplasm of breast: Secondary | ICD-10-CM

## 2012-12-07 ENCOUNTER — Ambulatory Visit: Payer: No Typology Code available for payment source | Admitting: Neurology

## 2012-12-17 ENCOUNTER — Ambulatory Visit (HOSPITAL_COMMUNITY)
Admission: RE | Admit: 2012-12-17 | Discharge: 2012-12-17 | Disposition: A | Discharge status: Home / Self Care | Payer: No Typology Code available for payment source | Source: Ambulatory Visit | Attending: Family Medicine | Admitting: Family Medicine

## 2012-12-17 DIAGNOSIS — Z1231 Encounter for screening mammogram for malignant neoplasm of breast: Secondary | ICD-10-CM

## 2013-06-27 ENCOUNTER — Emergency Department (HOSPITAL_BASED_OUTPATIENT_CLINIC_OR_DEPARTMENT_OTHER): Payer: No Typology Code available for payment source

## 2013-06-27 ENCOUNTER — Emergency Department (HOSPITAL_BASED_OUTPATIENT_CLINIC_OR_DEPARTMENT_OTHER)
Admission: EM | Admit: 2013-06-27 | Discharge: 2013-06-27 | Disposition: A | Discharge status: Home / Self Care | Payer: No Typology Code available for payment source | Attending: Emergency Medicine | Admitting: Emergency Medicine

## 2013-06-27 ENCOUNTER — Encounter (HOSPITAL_BASED_OUTPATIENT_CLINIC_OR_DEPARTMENT_OTHER): Payer: Self-pay | Admitting: Emergency Medicine

## 2013-06-27 DIAGNOSIS — J45909 Unspecified asthma, uncomplicated: Secondary | ICD-10-CM | POA: Insufficient documentation

## 2013-06-27 DIAGNOSIS — F329 Major depressive disorder, single episode, unspecified: Secondary | ICD-10-CM | POA: Insufficient documentation

## 2013-06-27 DIAGNOSIS — IMO0001 Reserved for inherently not codable concepts without codable children: Secondary | ICD-10-CM | POA: Insufficient documentation

## 2013-06-27 DIAGNOSIS — Z79899 Other long term (current) drug therapy: Secondary | ICD-10-CM | POA: Insufficient documentation

## 2013-06-27 DIAGNOSIS — Z87891 Personal history of nicotine dependence: Secondary | ICD-10-CM | POA: Insufficient documentation

## 2013-06-27 DIAGNOSIS — R059 Cough, unspecified: Secondary | ICD-10-CM | POA: Insufficient documentation

## 2013-06-27 DIAGNOSIS — F411 Generalized anxiety disorder: Secondary | ICD-10-CM | POA: Insufficient documentation

## 2013-06-27 DIAGNOSIS — I10 Essential (primary) hypertension: Secondary | ICD-10-CM | POA: Insufficient documentation

## 2013-06-27 DIAGNOSIS — F3289 Other specified depressive episodes: Secondary | ICD-10-CM | POA: Insufficient documentation

## 2013-06-27 DIAGNOSIS — R05 Cough: Secondary | ICD-10-CM | POA: Insufficient documentation

## 2013-06-27 DIAGNOSIS — E119 Type 2 diabetes mellitus without complications: Secondary | ICD-10-CM | POA: Insufficient documentation

## 2013-06-27 DIAGNOSIS — K5289 Other specified noninfective gastroenteritis and colitis: Secondary | ICD-10-CM | POA: Insufficient documentation

## 2013-06-27 DIAGNOSIS — Z880 Allergy status to penicillin: Secondary | ICD-10-CM | POA: Insufficient documentation

## 2013-06-27 DIAGNOSIS — K529 Noninfective gastroenteritis and colitis, unspecified: Secondary | ICD-10-CM

## 2013-06-27 LAB — LIPASE, BLOOD: Lipase: 15 U/L (ref 11–59)

## 2013-06-27 LAB — CBC WITH DIFFERENTIAL/PLATELET
BASOS ABS: 0 10*3/uL (ref 0.0–0.1)
Basophils Relative: 0 % (ref 0–1)
Eosinophils Absolute: 0 10*3/uL (ref 0.0–0.7)
Eosinophils Relative: 0 % (ref 0–5)
HEMATOCRIT: 36.3 % (ref 36.0–46.0)
Hemoglobin: 12.2 g/dL (ref 12.0–15.0)
LYMPHS PCT: 5 % — AB (ref 12–46)
Lymphs Abs: 0.6 10*3/uL — ABNORMAL LOW (ref 0.7–4.0)
MCH: 29.9 pg (ref 26.0–34.0)
MCHC: 33.6 g/dL (ref 30.0–36.0)
MCV: 89 fL (ref 78.0–100.0)
MONO ABS: 0.3 10*3/uL (ref 0.1–1.0)
Monocytes Relative: 2 % — ABNORMAL LOW (ref 3–12)
NEUTROS ABS: 10.3 10*3/uL — AB (ref 1.7–7.7)
Neutrophils Relative %: 92 % — ABNORMAL HIGH (ref 43–77)
PLATELETS: 213 10*3/uL (ref 150–400)
RBC: 4.08 MIL/uL (ref 3.87–5.11)
RDW: 13.2 % (ref 11.5–15.5)
WBC: 11.1 10*3/uL — AB (ref 4.0–10.5)

## 2013-06-27 LAB — COMPREHENSIVE METABOLIC PANEL
ALT: 27 U/L (ref 0–35)
AST: 19 U/L (ref 0–37)
Albumin: 3.7 g/dL (ref 3.5–5.2)
Alkaline Phosphatase: 63 U/L (ref 39–117)
BILIRUBIN TOTAL: 0.3 mg/dL (ref 0.3–1.2)
BUN: 17 mg/dL (ref 6–23)
CALCIUM: 9 mg/dL (ref 8.4–10.5)
CHLORIDE: 99 meq/L (ref 96–112)
CO2: 25 meq/L (ref 19–32)
Creatinine, Ser: 0.6 mg/dL (ref 0.50–1.10)
GFR calc non Af Amer: >90 mL/min (ref >90)
GLUCOSE: 122 mg/dL — AB (ref 70–99)
Potassium: 4 mEq/L (ref 3.7–5.3)
SODIUM: 138 meq/L (ref 137–147)
Total Protein: 6.8 g/dL (ref 6.0–8.3)

## 2013-06-27 LAB — CBG MONITORING, ED: Glucose-Capillary: 109 mg/dL — ABNORMAL HIGH (ref 70–99)

## 2013-06-27 MED ORDER — ONDANSETRON HCL 4 MG/2ML IJ SOLN
4.0000 mg | Freq: Once | INTRAMUSCULAR | Status: AC
  Administered 2013-06-27: 4 mg via INTRAVENOUS
  Filled 2013-06-27: qty 2

## 2013-06-27 MED ORDER — SODIUM CHLORIDE 0.9 % IV SOLN: INTRAVENOUS | Status: DC

## 2013-06-27 MED ORDER — ONDANSETRON 4 MG PO TBDP: 4.0000 mg | ORAL_TABLET | Freq: Three times a day (TID) | ORAL | Status: DC | PRN

## 2013-06-27 MED ORDER — HYDROMORPHONE HCL PF 1 MG/ML IJ SOLN
1.0000 mg | Freq: Once | INTRAMUSCULAR | Status: AC
  Administered 2013-06-27: 1 mg via INTRAVENOUS
  Filled 2013-06-27: qty 1

## 2013-06-27 MED ORDER — HYDROCODONE-ACETAMINOPHEN 5-325 MG PO TABS: 1.0000 | ORAL_TABLET | Freq: Four times a day (QID) | ORAL | Status: DC | PRN

## 2013-06-27 MED ORDER — SODIUM CHLORIDE 0.9 % IV BOLUS (SEPSIS)
1000.0000 mL | Freq: Once | INTRAVENOUS | Status: AC
  Administered 2013-06-27: 1000 mL via INTRAVENOUS

## 2013-06-27 NOTE — ED Notes (Signed)
rx x 2 given for norco and zofran- d/c with ride

## 2013-06-27 NOTE — Discharge Instructions (Signed)
Diet for Diarrhea, Adult °Frequent, runny stools (diarrhea) may be caused or worsened by food or drink. Diarrhea may be relieved by changing your diet. Since diarrhea can last up to 7 days, it is easy for you to lose too much fluid from the body and become dehydrated. Fluids that are lost need to be replaced. Along with a modified diet, make sure you drink enough fluids to keep your urine clear or pale yellow. °DIET INSTRUCTIONS °· Ensure adequate fluid intake (hydration): have 1 cup (8 oz) of fluid for each diarrhea episode. Avoid fluids that contain simple sugars or sports drinks, fruit juices, whole milk products, and sodas. Your urine should be clear or pale yellow if you are drinking enough fluids. Hydrate with an oral rehydration solution that you can purchase at pharmacies, retail stores, and online. You can prepare an oral rehydration solution at home by mixing the following ingredients together: °·   tsp table salt. °· ¾ tsp baking soda. °·  tsp salt substitute containing potassium chloride. °· 1  tablespoons sugar. °· 1 L (34 oz) of water. °· Certain foods and beverages may increase the speed at which food moves through the gastrointestinal (GI) tract. These foods and beverages should be avoided and include: °· Caffeinated and alcoholic beverages. °· High-fiber foods, such as raw fruits and vegetables, nuts, seeds, and whole grain breads and cereals. °· Foods and beverages sweetened with sugar alcohols, such as xylitol, sorbitol, and mannitol. °· Some foods may be well tolerated and may help thicken stool including: °· Starchy foods, such as rice, toast, pasta, low-sugar cereal, oatmeal, grits, baked potatoes, crackers, and bagels.   °· Bananas.   °· Applesauce. °· Add probiotic-rich foods to help increase healthy bacteria in the GI tract, such as yogurt and fermented milk products. °RECOMMENDED FOODS AND BEVERAGES °Starches °Choose foods with less than 2 g of fiber per serving. °· Recommended:  [place],  French, and pita breads, plain rolls, buns, bagels. Plain muffins, matzo. Soda, saltine, or graham crackers. Pretzels, melba toast, zwieback. Cooked cereals made with water: cornmeal, farina, cream cereals. Dry cereals: refined corn, wheat, rice. Potatoes prepared any way without skins, refined macaroni, spaghetti, noodles, refined rice. °· Avoid:  Bread, rolls, or crackers made with whole wheat, multi-grains, rye, bran seeds, nuts, or coconut. Corn tortillas or taco shells. Cereals containing whole grains, multi-grains, bran, coconut, nuts, raisins. Cooked or dry oatmeal. Coarse wheat cereals, granola. Cereals advertised as "high-fiber." Potato skins. Whole grain pasta, wild or brown rice. Popcorn. Sweet potatoes, yams. Sweet rolls, doughnuts, waffles, pancakes, sweet breads. °Vegetables °· Recommended: Strained tomato and vegetable juices. Most well-cooked and canned vegetables without seeds. Fresh: Tender lettuce, cucumber without the skin, cabbage, spinach, bean sprouts. °· Avoid: Fresh, cooked, or canned: Artichokes, baked beans, beet greens, broccoli, Brussels sprouts, corn, kale, legumes, peas, sweet potatoes. Cooked: Green or red cabbage, spinach. Avoid large servings of any vegetables because vegetables shrink when cooked, and they contain more fiber per serving than fresh vegetables. °Fruit °· Recommended: Cooked or canned: Apricots, applesauce, cantaloupe, cherries, fruit cocktail, grapefruit, grapes, kiwi, mandarin oranges, peaches, pears, plums, watermelon. Fresh: Apples without skin, ripe banana, grapes, cantaloupe, cherries, grapefruit, peaches, oranges, plums. Keep servings limited to ½ cup or 1 piece. °· Avoid: Fresh: Apples with skin, apricots, mangoes, pears, raspberries, strawberries. Prune juice, stewed or dried prunes. Dried fruits, raisins, dates. Large servings of all fresh fruits. °Protein °· Recommended: Ground or well-cooked tender beef, ham, veal, lamb, pork, or poultry. Eggs. Fish,  oysters, shrimp,   lobster, other seafoods. Liver, organ meats.  Avoid: Tough, fibrous meats with gristle. Peanut butter, smooth or chunky. Cheese, nuts, seeds, legumes, dried peas, beans, lentils. Dairy  Recommended: Yogurt, lactose-free milk, kefir, drinkable yogurt, buttermilk, soy milk, or plain hard cheese.  Avoid: Milk, chocolate milk, beverages made with milk, such as milkshakes. Soups  Recommended: Bouillon, broth, or soups made from allowed foods. Any strained soup.  Avoid: Soups made from vegetables that are not allowed, cream or milk-based soups. Desserts and Sweets  Recommended: Sugar-free gelatin, sugar-free frozen ice pops made without sugar alcohol.  Avoid: Plain cakes and cookies, pie made with fruit, pudding, custard, cream pie. Gelatin, fruit, ice, sherbet, frozen ice pops. Ice cream, ice milk without nuts. Plain hard candy, honey, jelly, molasses, syrup, sugar, chocolate syrup, gumdrops, marshmallows. Fats and Oils  Recommended: Limit fats to less than 8 tsp per day.  Avoid: Seeds, nuts, olives, avocados. Margarine, butter, cream, mayonnaise, salad oils, plain salad dressings. Plain gravy, crisp bacon without rind. Beverages  Recommended: Water, decaffeinated teas, oral rehydration solutions, sugar-free beverages not sweetened with sugar alcohols.  Avoid: Fruit juices, caffeinated beverages (coffee, tea, soda), alcohol, sports drinks, or lemon-lime soda. Condiments  Recommended: Ketchup, mustard, horseradish, vinegar, cocoa powder. Spices in moderation: allspice, basil, bay leaves, celery powder or leaves, cinnamon, cumin powder, curry powder, ginger, mace, marjoram, onion or garlic powder, oregano, paprika, parsley flakes, ground pepper, rosemary, sage, savory, tarragon, thyme, turmeric.  Avoid: Coconut, honey. Document Released: 06/29/2003 Document Revised: 01/01/2012 Document Reviewed: 08/23/2011 [facility] Patient Information 2014 [place].  Symptoms  consistent with a gastroenteritis-type illness. Acute onset with vomiting and diarrhea. As we discussed recommend the liquid hydration with a little bit of sugar to provide some calories. Start back on your diabetic medicines when she start back with a pretty good diet. Would advance to a bland diet once tolerating liquids. This can get filled at the pharmacy on [address] that's open 24 7. Return for any new or worse symptoms or if not improving in one to 2 days.

## 2013-06-27 NOTE — ED Provider Notes (Signed)
CSN: [id number]     Arrival date & time 06/27/13  1614 History   This chart was scribed for Shelda JakesScott W. Jocob Dambach, MD by Manuela Schwartzaylor Day, ED scribe. This patient was seen in room MH03/MH03 and the patient's care was started at 1614.  Chief Complaint  Patient presents with  . Nausea  . Emesis  . Diarrhea   Patient is a 58 y.o. female presenting with vomiting and diarrhea. The history is provided by the patient. No language interpreter was used.  Emesis Severity:  Moderate Duration:  18 hours Timing:  Intermittent Quality:  Stomach contents Progression:  Unchanged Chronicity:  New Relieved by:  Nothing Worsened by:  Nothing tried Ineffective treatments:  None tried Associated symptoms: abdominal pain, cough, diarrhea, headaches and myalgias   Associated symptoms: no chills, no fever and no sore throat   Diarrhea Associated symptoms: abdominal pain, cough, headaches, myalgias and vomiting   Associated symptoms: no chills    HPI Comments: [name] is a [age] y.o. female who presents to the Emergency Department with h/o diverticulitis complaining of multiple episodes of first diarrhea, followed by emesis episodes hours later onset midnight last PM, about 18 hours ago. She reports intermittent mild to moderate, 9/10 (max 10/10 today PTA) abdominal pain/cramping. Last emesis episode x2 hours ago and diarrhea within past hour. No blood in either vomitus or stool. She denise any sick contacts. She did not check her CBG today. She reports associated milg cough, NVD, neck soreness, myalgias, dysuria and HA. She denies any fever, CP, SOB, hematuria, lower leg swelling, skin rash, hemophilia  Her PCP is Dr. Carlyon ShadowSteven Myers  Past Medical History  Diagnosis Date  . Diabetes mellitus   . Hypertension   . Depression   . Anxiety   . Asthma     related to seasonal allergies   Past Surgical History  Procedure Laterality Date  . Incontinence surgery    . Shoulder surgery     No family history on  file. History  Substance Use Topics  . Smoking status: Former Smoker    Types: Cigarettes  . Smokeless tobacco: Never Used  . Alcohol Use: Yes   OB History   Grav Para Term Preterm Abortions TAB SAB Ect Mult Living                 Review of Systems  Constitutional: Negative for chills and fatigue.  HENT: Negative for rhinorrhea and sore throat.   Eyes: Negative for visual disturbance.  Respiratory: Negative for cough and shortness of breath.   Cardiovascular: Negative for chest pain and leg swelling.  Gastrointestinal: Positive for vomiting, abdominal pain and diarrhea. Negative for nausea.  Musculoskeletal: Positive for myalgias. Negative for back pain.  Skin: Negative for rash.  Neurological: Positive for headaches.  Hematological: Does not bruise/bleed easily.  Psychiatric/Behavioral: Negative for confusion.  All other systems reviewed and are negative.   Allergies  Penicillins  Home Medications   Current Outpatient Rx  Name  Route  Sig  Dispense  Refill  . albuterol (PROVENTIL HFA;VENTOLIN HFA) 108 (90 BASE) MCG/ACT inhaler   Inhalation   Inhale 2 puffs into the lungs every 6 (six) hours as needed.         . ALPRAZolam (XANAX) 1 MG tablet   Oral   Take 1 mg by mouth 3 (three) times daily as needed. Anxiety         . amLODipine (NORVASC) 10 MG tablet   Oral   Take 10 mg  by mouth daily.         Marland Kitchen atenolol (TENORMIN) 50 MG tablet   Oral   Take 50 mg by mouth daily.         . Cyanocobalamin (VITAMIN B-12) 2500 MCG SUBL   Sublingual   Place under the tongue daily.         Marland Kitchen FLUoxetine (PROZAC) 20 MG capsule   Oral   Take 20 mg by mouth daily.         Marland Kitchen HYDROcodone-acetaminophen (NORCO/VICODIN) 5-325 MG per tablet   Oral   Take 1-2 tablets by mouth every 6 (six) hours as needed for moderate pain.   20 tablet   0   . metFORMIN (GLUCOPHAGE) 1000 MG tablet   Oral   Take 1,000 mg by mouth 2 (two) times daily with a meal.         .  ondansetron (ZOFRAN ODT) 4 MG disintegrating tablet   Oral   Take 1 tablet (4 mg total) by mouth every 8 (eight) hours as needed for nausea or vomiting.   12 tablet   1   . sertraline (ZOLOFT) 50 MG tablet   Oral   Take 25 mg by mouth daily.          Triage Vitals: BP 115/76  Pulse 105  Temp(Src) 98.7 F (37.1 C) (Oral)  Resp 20  SpO2 99%  Physical Exam  Nursing note and vitals reviewed. Constitutional: She is oriented to person, place, and time. She appears well-developed and well-nourished. No distress.  HENT:  Head: Normocephalic and atraumatic.  Mouth/Throat: Oropharynx is clear and moist.  Moist mucous membranes  Eyes: Conjunctivae and EOM are normal. Right eye exhibits no discharge. Left eye exhibits no discharge. No scleral icterus.  Sclera are clear  Neck: Normal range of motion.  Cardiovascular: Normal rate, regular rhythm and normal heart sounds.   No murmur heard. Pulmonary/Chest: Effort normal and breath sounds normal. No respiratory distress. She has no wheezes. She has no rales.  Abdominal: Soft. Bowel sounds are normal. She exhibits no distension. There is no tenderness.  Musculoskeletal: Normal range of motion. She exhibits no edema.  No swelling in ankles  Neurological: She is alert and oriented to person, place, and time. No cranial nerve deficit.  NV grossly intact  Skin: Skin is warm and dry.  Psychiatric: She has a normal mood and affect. Thought content normal.   ED Course  Procedures (including critical care time) DIAGNOSTIC STUDIES: Oxygen Saturation is 99% on room air, normal by my interpretation.    COORDINATION OF CARE: At 610 PM Discussed treatment plan with patient which includes blood work, abdominal X-ray, CXR, UA, IV fluids, nausea/pain medicine. Patient agrees.   Medications  sodium chloride 0.9 % bolus 1,000 mL (0 mLs Intravenous Stopped 06/27/13 1921)  ondansetron (ZOFRAN) injection 4 mg (4 mg Intravenous Given 06/27/13 1824)   HYDROmorphone (DILAUDID) injection 1 mg (1 mg Intravenous Given 06/27/13 1826)   Results for orders placed during the hospital encounter of 06/27/13  CBC WITH DIFFERENTIAL      Result Value Ref Range   WBC 11.1 (*) 4.0 - 10.5 K/uL   RBC 4.08  3.87 - 5.11 MIL/uL   Hemoglobin 12.2  12.0 - 15.0 g/dL   HCT 16.1  09.6 - 04.5 %   MCV 89.0  78.0 - 100.0 fL   MCH 29.9  26.0 - 34.0 pg   MCHC 33.6  30.0 - 36.0 g/dL   RDW 40.9  11.5 - 15.5 %   Platelets 213  150 - 400 K/uL   Neutrophils Relative % 92 (*) 43 - 77 %   Neutro Abs 10.3 (*) 1.7 - 7.7 K/uL   Lymphocytes Relative 5 (*) 12 - 46 %   Lymphs Abs 0.6 (*) 0.7 - 4.0 K/uL   Monocytes Relative 2 (*) 3 - 12 %   Monocytes Absolute 0.3  0.1 - 1.0 K/uL   Eosinophils Relative 0  0 - 5 %   Eosinophils Absolute 0.0  0.0 - 0.7 K/uL   Basophils Relative 0  0 - 1 %   Basophils Absolute 0.0  0.0 - 0.1 K/uL  COMPREHENSIVE METABOLIC PANEL      Result Value Ref Range   Sodium 138  137 - 147 mEq/L   Potassium 4.0  3.7 - 5.3 mEq/L   Chloride 99  96 - 112 mEq/L   CO2 25  19 - 32 mEq/L   Glucose, Bld 122 (*) 70 - 99 mg/dL   BUN 17  6 - 23 mg/dL   Creatinine, Ser 1.61  0.50 - 1.10 mg/dL   Calcium 9.0  8.4 - 09.6 mg/dL   Total Protein 6.8  6.0 - 8.3 g/dL   Albumin 3.7  3.5 - 5.2 g/dL   AST 19  0 - 37 U/L   ALT 27  0 - 35 U/L   Alkaline Phosphatase 63  39 - 117 U/L   Total Bilirubin 0.3  0.3 - 1.2 mg/dL   GFR calc non Af Amer >90  >90 mL/min   GFR calc Af Amer >90  >90 mL/min  LIPASE, BLOOD      Result Value Ref Range   Lipase 15  11 - 59 U/L  CBG MONITORING, ED      Result Value Ref Range   Glucose-Capillary 109 (*) 70 - 99 mg/dL   No results found.   EKG Interpretation None      MDM   Final diagnoses:  Gastroenteritis   Patient symptoms consistent with a viral gastroenteritis. The x-ray not consistent with any bowel obstruction. Patients and is also not consistent with diverticulitis. Patient's had history of that in the past. Patient  blood sugars are reasonable for signs of acidosis no electrolyte abnormalities. We'll treat as a gastroenteritis will continue hydrocodone for pain and Zofran for the vomiting. Suspect patient will improve in the next one to 2 days. Patient will return for any newer worse symptoms. Abdomen was soft nontender.    I personally performed the services described in this documentation, which was scribed in my presence. The recorded information has been reviewed and is accurate.      Shelda Jakes, MD 06/27/13 [phone]

## 2013-06-27 NOTE — ED Notes (Signed)
Patient states that she woke up around midnight and has been experiencing n/v/d since that time, unable to hold anything down, c/o abd cramping

## 2013-12-06 ENCOUNTER — Other Ambulatory Visit (HOSPITAL_COMMUNITY): Payer: Self-pay | Admitting: Family Medicine

## 2013-12-06 DIAGNOSIS — Z1231 Encounter for screening mammogram for malignant neoplasm of breast: Secondary | ICD-10-CM

## 2014-01-26 ENCOUNTER — Ambulatory Visit (HOSPITAL_COMMUNITY): Payer: No Typology Code available for payment source

## 2014-05-05 ENCOUNTER — Emergency Department (HOSPITAL_COMMUNITY)
Admission: EM | Admit: 2014-05-05 | Discharge: 2014-05-06 | Disposition: A | Discharge status: Home / Self Care | Payer: 59 | Attending: Emergency Medicine | Admitting: Emergency Medicine

## 2014-05-05 ENCOUNTER — Emergency Department (HOSPITAL_COMMUNITY): Payer: 59

## 2014-05-05 ENCOUNTER — Encounter (HOSPITAL_COMMUNITY): Payer: Self-pay | Admitting: *Deleted

## 2014-05-05 DIAGNOSIS — Z79899 Other long term (current) drug therapy: Secondary | ICD-10-CM | POA: Insufficient documentation

## 2014-05-05 DIAGNOSIS — E119 Type 2 diabetes mellitus without complications: Secondary | ICD-10-CM | POA: Insufficient documentation

## 2014-05-05 DIAGNOSIS — J45901 Unspecified asthma with (acute) exacerbation: Secondary | ICD-10-CM | POA: Diagnosis not present

## 2014-05-05 DIAGNOSIS — R109 Unspecified abdominal pain: Secondary | ICD-10-CM | POA: Diagnosis not present

## 2014-05-05 DIAGNOSIS — F419 Anxiety disorder, unspecified: Secondary | ICD-10-CM | POA: Insufficient documentation

## 2014-05-05 DIAGNOSIS — F329 Major depressive disorder, single episode, unspecified: Secondary | ICD-10-CM | POA: Insufficient documentation

## 2014-05-05 DIAGNOSIS — J40 Bronchitis, not specified as acute or chronic: Secondary | ICD-10-CM

## 2014-05-05 DIAGNOSIS — R0602 Shortness of breath: Secondary | ICD-10-CM | POA: Diagnosis present

## 2014-05-05 DIAGNOSIS — Z7951 Long term (current) use of inhaled steroids: Secondary | ICD-10-CM | POA: Insufficient documentation

## 2014-05-05 DIAGNOSIS — I10 Essential (primary) hypertension: Secondary | ICD-10-CM | POA: Insufficient documentation

## 2014-05-05 DIAGNOSIS — Z87891 Personal history of nicotine dependence: Secondary | ICD-10-CM | POA: Insufficient documentation

## 2014-05-05 NOTE — ED Notes (Signed)
Pt states that she has had increase cough and congestion for approx 10 days; pt states that she was seen on Fri and Sat ans was diagnosed with possible Pneumonia and began on Prednisone, Z-Pack and Symbicort inhaler; pt states that she has felt more weak and tired today and woke up around 2230 with a "coughing spell"; pt states that she was unable to catch her breath and had to use rescue inhaler; pt states that she also took a Xanax to try to help calm her down; pt able to speak in sentences in triage but is coughing some while trying to do so.

## 2014-05-06 LAB — CBC
HCT: 35.5 % — ABNORMAL LOW (ref 36.0–46.0)
Hemoglobin: 11.8 g/dL — ABNORMAL LOW (ref 12.0–15.0)
MCH: 30.3 pg (ref 26.0–34.0)
MCHC: 33.2 g/dL (ref 30.0–36.0)
MCV: 91 fL (ref 78.0–100.0)
PLATELETS: 250 10*3/uL (ref 150–400)
RBC: 3.9 MIL/uL (ref 3.87–5.11)
RDW: 12.8 % (ref 11.5–15.5)
WBC: 6.8 10*3/uL (ref 4.0–10.5)

## 2014-05-06 LAB — BASIC METABOLIC PANEL
Anion gap: 15 (ref 5–15)
BUN: 16 mg/dL (ref 6–23)
CALCIUM: 9.2 mg/dL (ref 8.4–10.5)
CHLORIDE: 102 meq/L (ref 96–112)
CO2: 20 mmol/L (ref 19–32)
CREATININE: 0.88 mg/dL (ref 0.50–1.10)
GFR calc non Af Amer: 71 mL/min — ABNORMAL LOW (ref >90)
GFR, EST AFRICAN AMERICAN: 82 mL/min — AB (ref >90)
Glucose, Bld: 138 mg/dL — ABNORMAL HIGH (ref 70–99)
Potassium: 3.2 mmol/L — ABNORMAL LOW (ref 3.5–5.1)
Sodium: 137 mmol/L (ref 135–145)

## 2014-05-06 LAB — I-STAT TROPONIN, ED: Troponin i, poc: 0 ng/mL (ref 0.00–0.08)

## 2014-05-06 NOTE — ED Provider Notes (Signed)
CSN: [id number]     Arrival date & time 05/05/14  2311 History   First MD Initiated Contact with Patient 05/06/14 0111     Chief Complaint  Patient presents with  . Shortness of Breath     (Consider location/radiation/quality/duration/timing/severity/associated sxs/prior Treatment) HPI Comments: Pt presents today c/o sudden onset SOB that occurred when laying down for bed. She laid into bed and felt like she was choking and could not catch her breath. She attempted Albuterol MDI x2 without relief. At that time she took Xanax 1mg  PO and had her husband begin driving her to the ED. She stated relief of SOB approx 15min S/P the Xanax.  She has chronic bronchitis Hx and was seen at walk-in clinic x2wks ago and was given prednisone, she denies and relief and saw PCP x1wk ago and was given Azithromycin and an additional course of prednisone. She took all medications as prescribed. She endorses this episode feeling similar to previous episodes of bronchitis exacerbation. She describes cough x2wks initially productive with thick yellow sputum changing to non-productive ~2d ago. She describes mild DOE, but denies CP, NVD, H/A, rhinorrhea, or facial pain. Patient was a 30pk year smoker, quit date 04/22/14.  Patient is a 59 y.o. female presenting with shortness of breath. The history is provided by the patient. No language interpreter was used.  Shortness of Breath Severity:  Mild Onset quality:  Sudden Progression:  Worsening Chronicity:  Chronic Relieved by: MDI and Xanax. Exacerbated by: lying supine. Associated symptoms: abdominal pain, cough and wheezing   Associated symptoms: no chest pain, no ear pain, no headaches, no neck pain, no rash, no sore throat and no vomiting   Risk factors: tobacco use     Past Medical History  Diagnosis Date  . Diabetes mellitus   . Hypertension   . Depression   . Anxiety   . Asthma     related to seasonal allergies   Past Surgical History  Procedure  Laterality Date  . Incontinence surgery    . Shoulder surgery    . Foot surgery     No family history on file. History  Substance Use Topics  . Smoking status: Former Smoker    Types: Cigarettes  . Smokeless tobacco: Never Used  . Alcohol Use: Yes   OB History    No data available     Review of Systems  Constitutional: Negative for appetite change and unexpected weight change.  HENT: Negative for congestion, ear pain, facial swelling, hearing loss, rhinorrhea, sinus pressure, sore throat and trouble swallowing.   Respiratory: Positive for cough, shortness of breath and wheezing.   Cardiovascular: Negative for chest pain, palpitations and leg swelling.  Gastrointestinal: Positive for abdominal pain. Negative for nausea, vomiting and diarrhea.  Endocrine: Negative for polyuria.  Genitourinary: Negative for dysuria, frequency, hematuria and difficulty urinating.  Musculoskeletal: Negative for myalgias, arthralgias and neck pain.  Skin: Negative for color change and rash.  Neurological: Negative for dizziness, seizures, syncope, numbness and headaches.      Allergies  Review of patient's allergies indicates no known allergies.  Home Medications   Prior to Admission medications   Medication Sig Start Date End Date Taking? Authorizing Provider  acidophilus (RISAQUAD) CAPS capsule Take 1 capsule by mouth daily.   Yes Historical Provider, MD  albuterol (PROVENTIL HFA;VENTOLIN HFA) 108 (90 BASE) MCG/ACT inhaler Inhale 2 puffs into the lungs every 6 (six) hours as needed.   Yes Historical Provider, MD  ALPRAZolam Prudy Feeler(XANAX) 1 MG tablet  Take 1 mg by mouth 3 (three) times daily as needed. Anxiety   Yes Historical Provider, MD  amLODipine (NORVASC) 10 MG tablet Take 10 mg by mouth daily.   Yes Historical Provider, MD  atenolol (TENORMIN) 50 MG tablet Take 50 mg by mouth daily.   Yes Historical Provider, MD  budesonide-formoterol (SYMBICORT) 160-4.5 MCG/ACT inhaler Inhale 2 puffs into the  lungs 2 (two) times daily.   Yes Historical Provider, MD  Cyanocobalamin (VITAMIN B-12) 2500 MCG SUBL Place under the tongue daily.   Yes Historical Provider, MD  fluticasone (FLONASE) 50 MCG/ACT nasal spray Place 2 sprays into both nostrils daily.   Yes Historical Provider, MD  ibuprofen (ADVIL,MOTRIN) 200 MG tablet Take 400 mg by mouth every 6 (six) hours as needed for moderate pain.   Yes Historical Provider, MD  metFORMIN (GLUCOPHAGE) 500 MG tablet Take 250 mg by mouth 2 (two) times daily with a meal.   Yes Historical Provider, MD  mometasone-formoterol (DULERA) 100-5 MCG/ACT AERO Inhale 2 puffs into the lungs 2 (two) times daily.   Yes Historical Provider, MD  predniSONE (DELTASONE) 10 MG tablet Take 10 mg by mouth as directed. Taper down as directed   Yes Historical Provider, MD  venlafaxine XR (EFFEXOR-XR) 75 MG 24 hr capsule Take 75 mg by mouth daily with breakfast.   Yes Historical Provider, MD  HYDROcodone-acetaminophen (NORCO/VICODIN) 5-325 MG per tablet Take 1-2 tablets by mouth every 6 (six) hours as needed for moderate pain. Patient not taking: Reported on 05/05/2014 06/27/13   Vanetta Mulders, MD  ondansetron (ZOFRAN ODT) 4 MG disintegrating tablet Take 1 tablet (4 mg total) by mouth every 8 (eight) hours as needed for nausea or vomiting. Patient not taking: Reported on 05/05/2014 06/27/13   Vanetta Mulders, MD   BP 106/52 mmHg  Pulse 69  Temp(Src) 97.4 F (36.3 C)  Resp 20  SpO2 99% Physical Exam  Constitutional: She is oriented to person, place, and time. She appears well-developed and well-nourished. No distress.  HENT:  Head: Normocephalic and atraumatic.  Mouth/Throat: Oropharynx is clear and moist.  Eyes: Conjunctivae and EOM are normal. Pupils are equal, round, and reactive to light.  Neck: Normal range of motion. Neck supple.  Cardiovascular: Normal rate, regular rhythm and normal heart sounds.  Exam reveals no gallop and no friction rub.   No murmur  heard. Pulmonary/Chest: Effort normal.  Slight exp wheeze in upper fields, clear lower fields b/l  Abdominal:  Soft non-tender to palpation x4  Musculoskeletal: Normal range of motion.  Lymphadenopathy:    She has no cervical adenopathy.  Neurological: She is alert and oriented to person, place, and time.  CN 3-12 grossly intact  Skin: Skin is warm and dry.  Psychiatric: She has a normal mood and affect.    ED Course  Procedures (including critical care time) Labs Review Labs Reviewed  BASIC METABOLIC PANEL - Abnormal; Notable for the following:    Potassium 3.2 (*)    Glucose, Bld 138 (*)    GFR calc non Af Amer 71 (*)    GFR calc Af Amer 82 (*)    All other components within normal limits  CBC - Abnormal; Notable for the following:    Hemoglobin 11.8 (*)    HCT 35.5 (*)    All other components within normal limits  I-STAT TROPOININ, ED    Imaging Review Dg Chest 2 View (if Patient Has Fever And/or Copd)  05/06/2014   CLINICAL DATA:  Worsening cough and  congestion for 10 days. Initial encounter.  EXAM: CHEST  2 VIEW  COMPARISON:  Chest radiograph performed 04/29/2014  FINDINGS: The lungs are well-aerated and clear. There is no evidence of focal opacification, pleural effusion or pneumothorax.  The heart is normal in size; the mediastinal contour is within normal limits. No acute osseous abnormalities are seen.  IMPRESSION: No acute cardiopulmonary process seen.   Electronically Signed   By: Roanna Raider M.D.   On: 05/06/2014 00:36     EKG Interpretation   Date/Time:  Thursday May 05 2014 23:17:48 EST Ventricular Rate:  72 PR Interval:  154 QRS Duration: 97 QT Interval:  407 QTC Calculation: 445 R Axis:   60 Text Interpretation:  Sinus rhythm Left atrial enlargement Baseline wander  in lead(s) V5 V6 Confirmed by OTTER  MD, OLGA ([id number]) on 05/06/2014  12:50:30 AM      MDM   Final diagnoses:  None    1. Bronchitis  She is currently completing a second  back-to-back course of steroids. Do not feel additional steroids are warranted and patient reports she does not want further steroid prescription. She is 100% RA saturation now, no tachypnea and feels much better. DDx: bronchospasm, relieved with inhaler use, vs anxiety, relieved with Xanax, vs mixed. Feel she is appropriate for discharge home with PCP follow up for recheck tomorrow.    Arnoldo Hooker, PA-C 05/06/14 0230  Olivia Mackie, MD 05/06/14 [phone]

## 2014-05-06 NOTE — ED Notes (Signed)
Patient c/o intermittent SOB with activity, HA, dizziness x1 week. Patient reports "dry cough".

## 2014-05-06 NOTE — Discharge Instructions (Signed)
FOLLOW UP WITH DR. [place] FOR RECHECK TOMORROW IF NO BETTER. ALWAYS, RETURN TO THE EMERGENCY DEPARTMENT IF SYMPTOMS WORSEN.   Dg Chest 2 View (if Patient Has Fever And/or Copd)  05/06/2014   CLINICAL DATA:  Worsening cough and congestion for 10 days. Initial encounter.  EXAM: CHEST  2 VIEW  COMPARISON:  Chest radiograph performed 04/29/2014  FINDINGS: The lungs are well-aerated and clear. There is no evidence of focal opacification, pleural effusion or pneumothorax.  The heart is normal in size; the mediastinal contour is within normal limits. No acute osseous abnormalities are seen.  IMPRESSION: No acute cardiopulmonary process seen.   Electronically Signed   By: Roanna RaiderJeffery  Chang M.D.   On: 05/06/2014 00:36

## 2015-07-22 DIAGNOSIS — E782 Mixed hyperlipidemia: Secondary | ICD-10-CM

## 2015-07-22 HISTORY — DX: Mixed hyperlipidemia: E78.2

## 2015-07-27 ENCOUNTER — Telehealth: Payer: Self-pay

## 2015-07-27 NOTE — Telephone Encounter (Signed)
Patient called and said that she received 3 Hepatitis B shots in 2002 here and she needs the dates  She said she was given them from Jacquelin HawkingShari Spradley who was our occupational health nurse at the time   [facility]

## 2015-07-28 ENCOUNTER — Telehealth: Payer: Self-pay | Admitting: Family Medicine

## 2015-08-01 NOTE — Telephone Encounter (Signed)
Spoke with patient. Records placed up front for her to pick up.

## 2015-08-01 NOTE — Telephone Encounter (Signed)
Records located and copy placed up front for patient to pick up.

## 2015-08-09 ENCOUNTER — Encounter: Payer: Self-pay | Admitting: Family Medicine

## 2015-08-09 ENCOUNTER — Ambulatory Visit: Payer: Self-pay | Admitting: Family Medicine

## 2015-08-28 ENCOUNTER — Encounter: Payer: Self-pay | Admitting: Family Medicine

## 2015-09-25 DIAGNOSIS — J42 Unspecified chronic bronchitis: Secondary | ICD-10-CM | POA: Insufficient documentation

## 2015-09-25 DIAGNOSIS — F411 Generalized anxiety disorder: Secondary | ICD-10-CM | POA: Insufficient documentation

## 2015-09-25 DIAGNOSIS — E118 Type 2 diabetes mellitus with unspecified complications: Secondary | ICD-10-CM | POA: Insufficient documentation

## 2015-09-25 DIAGNOSIS — E785 Hyperlipidemia, unspecified: Secondary | ICD-10-CM | POA: Insufficient documentation

## 2015-09-25 DIAGNOSIS — K219 Gastro-esophageal reflux disease without esophagitis: Secondary | ICD-10-CM | POA: Insufficient documentation

## 2015-09-25 DIAGNOSIS — I10 Essential (primary) hypertension: Secondary | ICD-10-CM | POA: Insufficient documentation

## 2015-12-18 DIAGNOSIS — M160 Bilateral primary osteoarthritis of hip: Secondary | ICD-10-CM | POA: Insufficient documentation

## 2016-01-24 ENCOUNTER — Other Ambulatory Visit: Payer: Self-pay | Admitting: Family Medicine

## 2016-01-24 DIAGNOSIS — Z1231 Encounter for screening mammogram for malignant neoplasm of breast: Secondary | ICD-10-CM

## 2016-01-25 ENCOUNTER — Ambulatory Visit: Payer: Self-pay

## 2016-01-31 ENCOUNTER — Ambulatory Visit: Payer: Self-pay

## 2016-04-04 ENCOUNTER — Telehealth: Payer: Self-pay | Admitting: Emergency Medicine

## 2016-04-04 ENCOUNTER — Other Ambulatory Visit: Payer: Self-pay | Admitting: Emergency Medicine

## 2016-04-04 NOTE — Telephone Encounter (Signed)
Refill refused, patient is not a patient at this office.

## 2016-04-04 NOTE — Telephone Encounter (Signed)
Patient is requesting refill for Metformin 500mg  twice daily. I didn't see any prior visits for pt. Please advise.

## 2016-04-11 ENCOUNTER — Other Ambulatory Visit: Payer: Self-pay | Admitting: Emergency Medicine

## 2016-05-07 ENCOUNTER — Ambulatory Visit: Payer: Self-pay

## 2016-05-20 DIAGNOSIS — M47816 Spondylosis without myelopathy or radiculopathy, lumbar region: Secondary | ICD-10-CM | POA: Insufficient documentation

## 2016-05-20 DIAGNOSIS — M47812 Spondylosis without myelopathy or radiculopathy, cervical region: Secondary | ICD-10-CM | POA: Insufficient documentation

## 2016-07-09 ENCOUNTER — Emergency Department (HOSPITAL_BASED_OUTPATIENT_CLINIC_OR_DEPARTMENT_OTHER)
Admission: EM | Admit: 2016-07-09 | Discharge: 2016-07-09 | Disposition: A | Discharge status: Home / Self Care | Payer: BLUE CROSS/BLUE SHIELD | Attending: Emergency Medicine | Admitting: Emergency Medicine

## 2016-07-09 ENCOUNTER — Encounter (HOSPITAL_BASED_OUTPATIENT_CLINIC_OR_DEPARTMENT_OTHER): Payer: Self-pay | Admitting: Emergency Medicine

## 2016-07-09 DIAGNOSIS — Z7984 Long term (current) use of oral hypoglycemic drugs: Secondary | ICD-10-CM | POA: Insufficient documentation

## 2016-07-09 DIAGNOSIS — E119 Type 2 diabetes mellitus without complications: Secondary | ICD-10-CM | POA: Diagnosis not present

## 2016-07-09 DIAGNOSIS — I10 Essential (primary) hypertension: Secondary | ICD-10-CM | POA: Insufficient documentation

## 2016-07-09 DIAGNOSIS — Z87891 Personal history of nicotine dependence: Secondary | ICD-10-CM | POA: Insufficient documentation

## 2016-07-09 DIAGNOSIS — M1990 Unspecified osteoarthritis, unspecified site: Secondary | ICD-10-CM | POA: Diagnosis not present

## 2016-07-09 DIAGNOSIS — M25512 Pain in left shoulder: Secondary | ICD-10-CM | POA: Diagnosis present

## 2016-07-09 DIAGNOSIS — Z79899 Other long term (current) drug therapy: Secondary | ICD-10-CM | POA: Insufficient documentation

## 2016-07-09 DIAGNOSIS — J45909 Unspecified asthma, uncomplicated: Secondary | ICD-10-CM | POA: Insufficient documentation

## 2016-07-09 MED ORDER — OXYCODONE HCL 5 MG PO TABS: 5.0000 mg | ORAL_TABLET | Freq: Two times a day (BID) | ORAL | 0 refills | Status: DC | PRN

## 2016-07-09 NOTE — ED Triage Notes (Signed)
Pt c/o "extreme" L arm pain. Pt has chronic pain in R arm, but states L arm pain is new. Pain awakens her from sleep and she describes it as a tightness "in the musculoskeletal area". Hx of rotator cuff repair on R. Has been followed by PCP, and physical therapy. Pt states she has taken "multiple medications" today that have not helped.

## 2016-07-09 NOTE — ED Provider Notes (Signed)
[facility] Provider Note   CSN: [id number] Arrival date & time: 07/09/16  0550     History   Chief Complaint Chief Complaint  Patient presents with  . Arm Pain    HPI [name] is a 61 y.o. female PMH of R rotator cuff surgery, R hip AVN,  here with pain in multiple joints.  She states that her pain started in the R shoulder but the surgery improved that for a number of years. She is now developing pain in her L shoulder and in her R hip.  She is taking diclofenac, gabapentin, tramadol, and flexeril without significant relief.  She is also trying OTC salon spa patches to the  L shoulder.  She is suppose to get an MRI outpatient but presents today because her pain flared up and she could not relieve it.  In addition, she is requesting that we get an MRI tonight for a diagnosis.     10 Systems reviewed and are negative for acute change except as noted in the HPI.   HPI  Past Medical History:  Diagnosis Date  . Anxiety and depression   . Asthma    related to seasonal allergies  . Diabetes mellitus with complication (HCC)    microalbuminuria 03/2015  . GERD (gastroesophageal reflux disease)   . Hyperlipidemia, mixed 07/2015  . Hypertension   . Vitamin D deficiency     There are no active problems to display for this patient.   Past Surgical History:  Procedure Laterality Date  . FOOT SURGERY    . INCONTINENCE SURGERY    . SHOULDER SURGERY    . TONSILLECTOMY      OB History    No data available       Home Medications    Prior to Admission medications   Medication Sig Start Date End Date Taking? Authorizing Provider  cyclobenzaprine (FLEXERIL) 10 MG tablet Take 10 mg by mouth 3 (three) times daily as needed for muscle spasms.   Yes Historical Provider, MD  diclofenac (VOLTAREN) 75 MG EC tablet Take 75 mg by mouth 2 (two) times daily.   Yes Historical Provider, MD  traMADol (ULTRAM) 50 MG tablet Take by mouth every 6 (six) hours as needed.   Yes  Historical Provider, MD  acidophilus (RISAQUAD) CAPS capsule Take 1 capsule by mouth daily.    Historical Provider, MD  albuterol (PROVENTIL HFA;VENTOLIN HFA) 108 (90 BASE) MCG/ACT inhaler Inhale 2 puffs into the lungs every 6 (six) hours as needed.    Historical Provider, MD  ALPRAZolam Prudy Feeler) 1 MG tablet Take 1 mg by mouth 3 (three) times daily as needed. Anxiety    Historical Provider, MD  amLODipine (NORVASC) 10 MG tablet Take 10 mg by mouth daily.    Historical Provider, MD  atenolol (TENORMIN) 50 MG tablet Take 50 mg by mouth daily.    Historical Provider, MD  budesonide-formoterol (SYMBICORT) 160-4.5 MCG/ACT inhaler Inhale 2 puffs into the lungs 2 (two) times daily.    Historical Provider, MD  Cyanocobalamin (VITAMIN B-12) 2500 MCG SUBL Place under the tongue daily.    Historical Provider, MD  fluticasone (FLONASE) 50 MCG/ACT nasal spray Place 2 sprays into both nostrils daily.    Historical Provider, MD  HYDROcodone-acetaminophen (NORCO/VICODIN) 5-325 MG per tablet Take 1-2 tablets by mouth every 6 (six) hours as needed for moderate pain. Patient not taking: Reported on 05/05/2014 06/27/13   Vanetta Mulders, MD  ibuprofen (ADVIL,MOTRIN) 200 MG tablet Take 400 mg  by mouth every 6 (six) hours as needed for moderate pain.    Historical Provider, MD  metFORMIN (GLUCOPHAGE) 500 MG tablet Take 250 mg by mouth 2 (two) times daily with a meal.    Historical Provider, MD  mometasone-formoterol (DULERA) 100-5 MCG/ACT AERO Inhale 2 puffs into the lungs 2 (two) times daily.    Historical Provider, MD  ondansetron (ZOFRAN ODT) 4 MG disintegrating tablet Take 1 tablet (4 mg total) by mouth every 8 (eight) hours as needed for nausea or vomiting. Patient not taking: Reported on 05/05/2014 06/27/13   Vanetta Mulders, MD  oxyCODONE (ROXICODONE) 5 MG immediate release tablet Take 1 tablet (5 mg total) by mouth 2 (two) times daily as needed for severe pain. 07/09/16   Tomasita Crumble, MD  predniSONE (DELTASONE) 10 MG  tablet Take 10 mg by mouth as directed. Taper down as directed    Historical Provider, MD  venlafaxine XR (EFFEXOR-XR) 75 MG 24 hr capsule Take 75 mg by mouth daily with breakfast.    Historical Provider, MD    Family History No family history on file.  Social History Social History  Substance Use Topics  . Smoking status: Former Smoker    Types: Cigarettes  . Smokeless tobacco: Never Used  . Alcohol use Yes     Comment: occasional     Allergies   Penicillins   Review of Systems Review of Systems   Physical Exam Updated Vital Signs BP (!) 142/73 (BP Location: Right Arm)   Pulse 85   Resp 19   Ht 5\' 10"  (1.778 m)   Wt 188 lb (85.3 kg)   SpO2 99%   BMI 26.98 kg/m   Physical Exam  Constitutional: She is oriented to person, place, and time. She appears well-developed and well-nourished. No distress.  HENT:  Head: Normocephalic and atraumatic.  Nose: Nose normal.  Mouth/Throat: Oropharynx is clear and moist. No oropharyngeal exudate.  Eyes: Conjunctivae and EOM are normal. Pupils are equal, round, and reactive to light. No scleral icterus.  Neck: Normal range of motion. Neck supple. No JVD present. No tracheal deviation present. No thyromegaly present.  Cardiovascular: Normal rate, regular rhythm and normal heart sounds.  Exam reveals no gallop and no friction rub.   No murmur heard. Pulmonary/Chest: Effort normal and breath sounds normal. No respiratory distress. She has no wheezes. She exhibits no tenderness.  Abdominal: Soft. Bowel sounds are normal. She exhibits no distension and no mass. There is no tenderness. There is no rebound and no guarding.  Musculoskeletal: Normal range of motion. She exhibits no edema, tenderness or deformity.  Splint to R wrist, no gross deformities.  Normal ROM of shoulder and hip joints.  No swelling seen.  No TTP  Lymphadenopathy:    She has no cervical adenopathy.  Neurological: She is alert and oriented to person, place, and time.  No cranial nerve deficit. She exhibits normal muscle tone.  Skin: Skin is warm and dry. No rash noted. No erythema. No pallor.  Nursing note and vitals reviewed.    ED Treatments / Results  Labs (all labs ordered are listed, but only abnormal results are displayed) Labs Reviewed - No data to display  EKG  EKG Interpretation None       Radiology No results found.  Procedures Procedures (including critical care time)  Medications Ordered in ED Medications - No data to display   Initial Impression / Assessment and Plan / ED Course  I have reviewed the triage vital signs  and the nursing notes.  Pertinent labs & imaging results that were available during my care of the patient were reviewed by me and considered in my medical decision making (see chart for details).     Patient presents to the ED for acute on chronic pain.  I spent time with her and discussed at length how to continue to fu with her ortho and wait for her insurance to clear her for MRI clearance.  She demonstrates good understanding of the plan.  She recognizes that she can not obtain MRI in the ED.  Will give oxycodone for severe pain.  Also gave another ortho referral for a 2nd opinion as requested.  She appeasr well and in NAD.  Vs remain within her normal limits and she is safe for DC.    Final Clinical Impressions(s) / ED Diagnoses   Final diagnoses:  Arthritis    New Prescriptions New Prescriptions   OXYCODONE (ROXICODONE) 5 MG IMMEDIATE RELEASE TABLET    Take 1 tablet (5 mg total) by mouth 2 (two) times daily as needed for severe pain.     Tomasita CrumbleAdeleke Curlie Macken, MD 07/09/16 [phone]

## 2016-07-30 ENCOUNTER — Ambulatory Visit (INDEPENDENT_AMBULATORY_CARE_PROVIDER_SITE_OTHER): Payer: BLUE CROSS/BLUE SHIELD | Admitting: Sports Medicine

## 2016-07-30 ENCOUNTER — Ambulatory Visit (INDEPENDENT_AMBULATORY_CARE_PROVIDER_SITE_OTHER): Payer: BLUE CROSS/BLUE SHIELD

## 2016-07-30 ENCOUNTER — Encounter: Payer: Self-pay | Admitting: Sports Medicine

## 2016-07-30 VITALS — BP 120/78 | HR 63 | Ht 70.0 in | Wt 184.6 lb

## 2016-07-30 DIAGNOSIS — M2550 Pain in unspecified joint: Secondary | ICD-10-CM | POA: Diagnosis not present

## 2016-07-30 DIAGNOSIS — M87059 Idiopathic aseptic necrosis of unspecified femur: Secondary | ICD-10-CM

## 2016-07-30 DIAGNOSIS — G8929 Other chronic pain: Secondary | ICD-10-CM

## 2016-07-30 DIAGNOSIS — M79641 Pain in right hand: Secondary | ICD-10-CM | POA: Diagnosis not present

## 2016-07-30 DIAGNOSIS — K219 Gastro-esophageal reflux disease without esophagitis: Secondary | ICD-10-CM | POA: Diagnosis not present

## 2016-07-30 DIAGNOSIS — M25511 Pain in right shoulder: Secondary | ICD-10-CM

## 2016-07-30 DIAGNOSIS — M4696 Unspecified inflammatory spondylopathy, lumbar region: Secondary | ICD-10-CM

## 2016-07-30 DIAGNOSIS — M25551 Pain in right hip: Secondary | ICD-10-CM

## 2016-07-30 DIAGNOSIS — M160 Bilateral primary osteoarthritis of hip: Secondary | ICD-10-CM | POA: Diagnosis not present

## 2016-07-30 DIAGNOSIS — M4692 Unspecified inflammatory spondylopathy, cervical region: Secondary | ICD-10-CM

## 2016-07-30 DIAGNOSIS — M47812 Spondylosis without myelopathy or radiculopathy, cervical region: Secondary | ICD-10-CM

## 2016-07-30 DIAGNOSIS — M47816 Spondylosis without myelopathy or radiculopathy, lumbar region: Secondary | ICD-10-CM

## 2016-07-30 HISTORY — DX: Idiopathic aseptic necrosis of unspecified femur: M87.059

## 2016-07-30 LAB — C-REACTIVE PROTEIN: CRP: 0.4 mg/dL — AB (ref 0.5–20.0)

## 2016-07-30 LAB — URIC ACID: URIC ACID, SERUM: 4.6 mg/dL (ref 2.4–7.0)

## 2016-07-30 LAB — SEDIMENTATION RATE: Sed Rate: 5 mm/hr (ref 0–30)

## 2016-07-30 MED ORDER — CELECOXIB 100 MG PO CAPS: 100.0000 mg | ORAL_CAPSULE | Freq: Two times a day (BID) | ORAL | 2 refills | Status: DC | PRN

## 2016-07-30 NOTE — Assessment & Plan Note (Signed)
Patient has multiple reasons this could be problematic for her.  She has recently undergone nerve conduction study and we will see if we can request these results.  I do not see them available in care everywhere.  She likely does have some underlying arthritis in her hands and if the nerve conduction studies are unrevealing will consider further investigation with plain film x-rays as well as possible [facility] ultrasound of the wrists.

## 2016-07-30 NOTE — Progress Notes (Signed)
OFFICE VISIT NOTE [name]. [name] [phone]  CACHET MCCUTCHEN - 61 y.o. female MRN [id number]  Date of birth: 1955/07/30  Visit Date: 07/30/2016  PCP: WHITE, Bonnell Public, NP   Referred by: April Manson, NP  SUBJECTIVE:   Chief Complaint  Patient presents with  . multiple joint pain    c-spine (arthritis), bilateral hips, low back, carpal tunnel right wrist, right shoulder. Pain is chronic. Pt has tried Voltaren, Gabapentin, Tramadol, and Flexeril with no relief. She has also done PT and tried dry needling. Pt has recent nerve conduction study for numbness and tingling in right hand. Pt had steriod injection in right acromioclavicular joint 04/25/16. Pt c/o increased numbness in right hand. She has seen Dr. Eduard Clos in the past for facet joint treatment.     HPI: As above. Additional pertinent information includes:  Patient presents today to establish care as she would like to transition her care to our clinic.  She is not interested in chronic pain management but it is frustrated with her current medical conditions and is looking to try to become more ambulatory and for helping her understand her underlying issues.  She has multiple polyarthralgic complaints most notable she would like to focus on the below:  Right Wrist and hand.   Underwent NCS - no known results    Using wrist splint without significant benefit. She reports pain that is severe with any type of grip strength or lifting activities.        Right Hip - groin to back of butt - into spasms into back   3 years - no help so far   Last hip X-rays 01/2016 - Mild to Moderate OA   Has been going on for several years - AVN has been mentioned and was seen on the CT scan of her abdomen and pelvis in 2013   3 [place] Hip injections - last in Sept - now only lasting for several weeks  Right Shoulder   Prior rotator cuff repair   R anterior - radiating to right  hand   Occasional swelling over the [facility] joint.  She had an injection AC joint earlier this year with only mild benefit.   She did share with [place] that an MRI request for multiple studies including of her neck, back, shoulder and hips has recently been reviewed and denied for unknown reasons.  She was told by her insurance company that paperwork was not properly submitted  She has tried multiple medications for the above including Flexeril and ibuprofen that seemed to be the most beneficial.  She is been referred to pain management but is not interested in taking chronic opioids at this time on a regular basis.   ROS: ROS  Otherwise per HPI.  HISTORY & PERTINENT PRIOR DATA:  No specialty comments available. She reports that she has quit smoking. Her smoking use included Cigarettes. She has never used smokeless tobacco. No results for input(s): HGBA1C, LABURIC in the last 8760 hours. Medications & Allergies reviewed per EMR Patient Active Problem List   Diagnosis Date Noted  . Right hip pain 07/30/2016  . Polyarthralgia 07/30/2016  . Chronic right shoulder pain 07/30/2016  . Right hand pain 07/30/2016  . Facet arthritis of lumbar region (HCC) 05/20/2016  . Facet arthritis of cervical region (HCC) 05/20/2016  . Primary osteoarthritis of both hips 12/18/2015  . Chronic bronchitis (HCC) 09/25/2015  . Essential hypertension 09/25/2015  .  GAD (generalized anxiety disorder) 09/25/2015  . Gastroesophageal reflux disease without esophagitis 09/25/2015  . Hyperlipemia 09/25/2015  . Type 2 diabetes mellitus with complication (HCC) 09/25/2015   Past Medical History:  Diagnosis Date  . Anxiety and depression   . Asthma    related to seasonal allergies  . Diabetes mellitus with complication (HCC)    microalbuminuria 03/2015  . GERD (gastroesophageal reflux disease)   . Hyperlipidemia, mixed 07/2015  . Hypertension   . Vitamin D deficiency    No family history on file. Past Surgical History:   Procedure Laterality Date  . FOOT SURGERY    . INCONTINENCE SURGERY    . SHOULDER SURGERY    . TONSILLECTOMY     Social History   Occupational History  . Not on file.   Social History Main Topics  . Smoking status: Former Smoker    Types: Cigarettes  . Smokeless tobacco: Never Used  . Alcohol use Yes     Comment: occasional  . Drug use: No  . Sexual activity: Not on file    OBJECTIVE:  VS:  HT:5\' 10"  (177.8 cm)   WT:184 lb 9.6 oz (83.7 kg)  BMI:26.5    BP:120/78  HR:63bpm  TEMP: ( )  RESP:97 % Physical Exam  Constitutional: She appears well-developed and well-nourished. She is cooperative.  Non-toxic appearance.  HENT:  Head: Normocephalic and atraumatic.  Cardiovascular: Intact distal pulses.   Pulmonary/Chest: No accessory muscle usage. No respiratory distress.  Neurological: She is alert. She is not disoriented. She displays normal reflexes. No sensory deficit.  Skin: Skin is warm, dry and intact. Capillary refill takes less than 2 seconds. No abrasion and no rash noted.  Psychiatric: She has a normal mood and affect. Her speech is normal and behavior is normal. Thought content normal.   Right shoulder: Pain with palpation over the [facility] joint. Limited abduction by 15 on the right compared to the left.  Pain with terminal external rotation.  Empty can testing is painful for her and slightly weak on the right compared to the left.  Pain is present with Yergason's testing as well.  Minimal pain with speeds testing.  Right wrist and hand.  Overall normal appearing Mildly decreased grip strength.  Generalized esthesia in the entire right upper extremity.  Minimal pain with arm squeeze test and brachial plexus squeeze.  Minimal pain with Tinel's and Phalen's but Phalen's does cause generalized dysesthesia throughout the entire distal arm and hand.  Right hip: Marked pain with logroll as well as Stinchfield testing.  Pain with internal and external rotation on the right  compared to the left.  Good flexion and extension of the hip without pain.   IMAGING & PROCEDURES: Dg Hip Unilat W Or W/o Pelvis 2-3 Views Right  Result Date: 07/30/2016 CLINICAL DATA:  Worsening right hip pain, no known injury EXAM: DG HIP (WITH OR WITHOUT PELVIS) 2-3V RIGHT COMPARISON:  CT scan 12/20/2011 FINDINGS: Two views of the right hip submitted. Again noted mild serpiginous subchondral sclerotic changes bilateral femoral head consistent with avascular necrosis. There is diffuse mild narrowing of right hip joint space. Mild spurring of right acetabulum. There is mild spurring bilateral greater femoral trochanter. No acute fracture or subluxation. No definite remodeling of femoral head. IMPRESSION: No acute fracture or subluxation. Osteoarthritic changes bilateral hip joints as described above. Again noted mild serpiginous sclerotic changes bilateral femoral head consistent with avascular necrosis. No definite remodeling of femoral head. Electronically Signed   By: Lang Snow  Pop M.D.   On: 07/30/2016 11:04   No additional findings.   ASSESSMENT & PLAN:  Visit Diagnoses:  1. Right hip pain   2. Polyarthralgia   3. Chronic right shoulder pain   4. Right hand pain   5. Primary osteoarthritis of both hips   6. Gastroesophageal reflux disease without esophagitis   7. Facet arthritis of cervical region (HCC)   8. Facet arthritis of lumbar region [facility])    Meds:  Meds ordered this encounter  Medications  . celecoxib (CELEBREX) 100 MG capsule    Sig: Take 1 capsule (100 mg total) by mouth 2 (two) times daily as needed.    Dispense:  60 capsule    Refill:  2    Orders:  Orders Placed This Encounter  Procedures  . DG HIP UNILAT W OR W/O PELVIS 2-3 VIEWS RIGHT  . MR HIP RIGHT WO CONTRAST  . Sedimentation rate  . C-reactive protein  . Uric acid  . Cyclic citrul peptide antibody, IgG  . Rheumatoid factor  . ANA    Follow-up: Return in about 2 weeks (around 08/13/2016).   Otherwise  please see problem oriented charting as below.

## 2016-07-30 NOTE — Assessment & Plan Note (Signed)
Will try transitioning her NSAID to Cox 2 inhibitor to see if this is beneficial for her GERD.

## 2016-07-30 NOTE — Assessment & Plan Note (Signed)
X-ray reveals sclerotic changes within the femoral head consistent with AVN.  Further characterization with MRI for staging and if stage III to stage IV changes would consider total hip arthroplasty as a viable option given the lack of efficacy with the most recent injection she has had.  Can consider ASA and/or bisphosphonate therapy as well.  We will plan to see her back after an MRI of the hip is obtained

## 2016-07-30 NOTE — Assessment & Plan Note (Signed)
Prior rotator cuff issues.  If persistent ongoing problems we will consider further advanced diagnostic imaging with likely MRI of the shoulder given postoperative changes will alter findings on [facility] ultrasound.  Plan to start with plain film x-rays as AC joint arthropathy may be a large contributing factor.

## 2016-07-30 NOTE — Assessment & Plan Note (Signed)
Some right-sided radiculitis may be present.  Will defer further evaluation until nerve conduction studies have been obtained.

## 2016-07-30 NOTE — Assessment & Plan Note (Signed)
Patient has multiple polyarthralgia complaints and reports never having any type of serologic workup.  Lab work obtained today.  We will plan to review this with her at her follow-up appointment after further information is been revealed.  If any overt abnormalities will plan to recheck with her by telephone.

## 2016-07-31 LAB — RHEUMATOID FACTOR: Rheumatoid fact SerPl-aCnc: <14 IU/mL (ref <14)

## 2016-07-31 LAB — ANA: Anti Nuclear Antibody(ANA): NEGATIVE

## 2016-07-31 LAB — CYCLIC CITRUL PEPTIDE ANTIBODY, IGG: Cyclic Citrullin Peptide Ab: <16 Units

## 2016-08-06 ENCOUNTER — Encounter: Payer: Self-pay | Admitting: Sports Medicine

## 2016-08-06 ENCOUNTER — Ambulatory Visit
Admission: RE | Admit: 2016-08-06 | Discharge: 2016-08-06 | Disposition: A | Discharge status: Home / Self Care | Payer: BLUE CROSS/BLUE SHIELD | Source: Ambulatory Visit | Attending: Sports Medicine | Admitting: Sports Medicine

## 2016-08-06 DIAGNOSIS — M25551 Pain in right hip: Secondary | ICD-10-CM

## 2016-08-06 DIAGNOSIS — M2550 Pain in unspecified joint: Secondary | ICD-10-CM

## 2016-08-13 ENCOUNTER — Encounter: Payer: Self-pay | Admitting: Sports Medicine

## 2016-08-14 ENCOUNTER — Telehealth: Payer: Self-pay | Admitting: Sports Medicine

## 2016-08-14 NOTE — Telephone Encounter (Signed)
Patient called in reference to appointment on Monday 08/19/16, stating she had records from [place] that she thinks would be very helpful and she would like Berline Chough to have on Monday. She does not have the records and says that the office at [facility] said they would need Berline Chough to request the records.  Please call and advise.

## 2016-08-15 NOTE — Telephone Encounter (Signed)
Patient returned phone call. I spoke to [name] who stated that the patient needs to fill out a medical record form on Monday at their appointment. Patient expressed understanding. No further action for this note.

## 2016-08-15 NOTE — Telephone Encounter (Signed)
Called [phone] and [facility] for pt to call the office.

## 2016-08-19 ENCOUNTER — Encounter: Payer: Self-pay | Admitting: Sports Medicine

## 2016-08-19 ENCOUNTER — Ambulatory Visit (INDEPENDENT_AMBULATORY_CARE_PROVIDER_SITE_OTHER): Payer: BLUE CROSS/BLUE SHIELD | Admitting: Sports Medicine

## 2016-08-19 DIAGNOSIS — M2550 Pain in unspecified joint: Secondary | ICD-10-CM | POA: Diagnosis not present

## 2016-08-19 DIAGNOSIS — M47812 Spondylosis without myelopathy or radiculopathy, cervical region: Secondary | ICD-10-CM

## 2016-08-19 DIAGNOSIS — M87051 Idiopathic aseptic necrosis of right femur: Secondary | ICD-10-CM | POA: Diagnosis not present

## 2016-08-19 DIAGNOSIS — M4692 Unspecified inflammatory spondylopathy, cervical region: Secondary | ICD-10-CM | POA: Diagnosis not present

## 2016-08-19 DIAGNOSIS — M160 Bilateral primary osteoarthritis of hip: Secondary | ICD-10-CM

## 2016-08-19 DIAGNOSIS — M4696 Unspecified inflammatory spondylopathy, lumbar region: Secondary | ICD-10-CM | POA: Diagnosis not present

## 2016-08-19 DIAGNOSIS — M25511 Pain in right shoulder: Secondary | ICD-10-CM

## 2016-08-19 DIAGNOSIS — M25551 Pain in right hip: Secondary | ICD-10-CM | POA: Diagnosis not present

## 2016-08-19 DIAGNOSIS — G8929 Other chronic pain: Secondary | ICD-10-CM | POA: Diagnosis not present

## 2016-08-19 DIAGNOSIS — I10 Essential (primary) hypertension: Secondary | ICD-10-CM

## 2016-08-19 DIAGNOSIS — M79641 Pain in right hand: Secondary | ICD-10-CM

## 2016-08-19 DIAGNOSIS — M47816 Spondylosis without myelopathy or radiculopathy, lumbar region: Secondary | ICD-10-CM

## 2016-08-19 MED ORDER — TRAMADOL HCL 50 MG PO TABS: 50.0000 mg | ORAL_TABLET | Freq: Four times a day (QID) | ORAL | 0 refills | Status: DC | PRN

## 2016-08-19 MED ORDER — GABAPENTIN 100 MG PO CAPS: 100.0000 mg | ORAL_CAPSULE | Freq: Three times a day (TID) | ORAL | 1 refills | Status: DC

## 2016-08-19 NOTE — Patient Instructions (Signed)
We are referring you to [facility] orthopedics for consideration of an anterior hip arthroplasty. (Hip replacement)  Try taking Tylenol (acetaminophen) 500 mg up to 4 times per day in addition to the tramadol, gabapentin and Celebrex.  Do not take any ibuprofen.  If we have not received the nerve conduction studies in the next 2 days will plan to proceed with a cervical spine MRI.

## 2016-08-19 NOTE — Progress Notes (Signed)
OFFICE VISIT NOTE [name]. [name] Sports Medicine [facility] at [facility] [phone]  [name] - 61 y.o. female MRN [id number]  Date of birth: 07/16/1955  Visit Date: 08/19/2016  PCP: WHITE, Bonnell Public, NP   Referred by: April Manson, NP  Clovis Cao, [place] acting as scribe for Dr. Berline Chough.  SUBJECTIVE:   Chief Complaint  Patient presents with  . Follow Up Multiple Joint Pain   Reports persistent RT shoulder pain raditing to RT hand causing numbness/tingling. Wearing wrist/hand brace with some relief. Nerve conduction studies performed, awaiting resuts from [facility].   Persistent RT Hip pain--wants to review previous MRI performed.  Continue daily pain.  Otherwise discomfort is doing quite well.  Prior autoimmune workup negative    ROS  Otherwise per HPI.  HISTORY & PERTINENT PRIOR DATA:  No specialty comments available. She reports that she has quit smoking. Her smoking use included Cigarettes. She has never used smokeless tobacco.   Recent Labs  07/30/16 1012  LABURIC 4.6   Medications & Allergies reviewed per EMR Patient Active Problem List   Diagnosis Date Noted  . AVN of femur (HCC) 07/30/2016  . Polyarthralgia 07/30/2016  . Chronic right shoulder pain 07/30/2016  . Right hand pain 07/30/2016  . Facet arthritis of lumbar region (HCC) 05/20/2016  . Facet arthritis of cervical region (HCC) 05/20/2016  . Primary osteoarthritis of both hips 12/18/2015  . Chronic bronchitis (HCC) 09/25/2015  . Essential hypertension 09/25/2015  . GAD (generalized anxiety disorder) 09/25/2015  . Gastroesophageal reflux disease without esophagitis 09/25/2015  . Hyperlipemia 09/25/2015  . Type 2 diabetes mellitus with complication (HCC) 09/25/2015   Past Medical History:  Diagnosis Date  . Anxiety and depression   . Asthma    related to seasonal allergies  . Diabetes mellitus with complication (HCC)    microalbuminuria 03/2015  . GERD  (gastroesophageal reflux disease)   . Hyperlipidemia, mixed 07/2015  . Hypertension   . Vitamin D deficiency    History reviewed. No pertinent family history. Past Surgical History:  Procedure Laterality Date  . FOOT SURGERY    . INCONTINENCE SURGERY    . SHOULDER SURGERY    . TONSILLECTOMY     Social History   Occupational History  . Not on file.   Social History Main Topics  . Smoking status: Former Smoker    Types: Cigarettes  . Smokeless tobacco: Never Used  . Alcohol use Yes     Comment: occasional  . Drug use: No  . Sexual activity: Not on file    OBJECTIVE:  VS:  HT:5\' 10"  (177.8 cm)   WT:186 lb 9.6 oz (84.6 kg)  BMI:26.8    BP:122/80  HR:77bpm  TEMP: ( )  RESP:98 % Physical Exam  Constitutional: She appears well-developed and well-nourished. She is cooperative.  Non-toxic appearance.  HENT:  Head: Normocephalic and atraumatic.  Cardiovascular: Intact distal pulses.   Pulmonary/Chest: No accessory muscle usage. No respiratory distress.  Musculoskeletal:       Arms:      Legs: Neurological: She is alert. She is not disoriented. She displays normal reflexes. No sensory deficit.  Skin: Skin is warm, dry and intact. Capillary refill takes less than 2 seconds. No abrasion and no rash noted.  Psychiatric: She has a normal mood and affect. Her speech is normal and behavior is normal. Thought content normal.   Left Hip Exam  Gait: Antalgic.  Right Hip Exam  Gait:  Antalgic.  Tests  Pearlean Brownie: Positive     IMAGING & PROCEDURES: Mr Hip Right Wo Contrast  Result Date: 08/06/2016 CLINICAL DATA:  Right hip pain for 1 year. Pain greater over the last few weeks. EXAM: MR OF THE RIGHT HIP WITHOUT CONTRAST TECHNIQUE: Multiplanar, multisequence MR imaging was performed. No intravenous contrast was administered. COMPARISON:  None. FINDINGS: Bones: No acute hip fracture or dislocation. Bilateral subchondral serpiginous signal abnormality in the femoral heads consistent  with avascular necrosis, right worse than left. No articular surface collapse. No significant surrounding marrow edema. No periosteal reaction or bone destruction. No aggressive osseous lesion. Normal sacrum and sacroiliac joints. No SI joint widening or erosive changes. Degenerative disc disease with disc height loss of the lower lumbar spine partially visualized. Articular cartilage and labrum Articular cartilage: High-grade partial-thickness cartilage loss with areas of full-thickness cartilage loss of the right femoral head and acetabulum with subchondral reactive marrow changes. Partial-thickness cartilage loss of the left femoral head and acetabulum. Labrum: Limited evaluation secondary to large field of view and lack of intra-articular fluid. Superior anterior right labral degeneration. Joint or bursal effusion Joint effusion: No hip joint effusion. Mild right hip synovitis. No SI joint effusion. Bursae:  No bursa formation. Muscles and tendons Flexors: Normal. Extensors: Normal. Abductors: Normal. Adductors: Normal. Gluteals: Small partial tear of the left gluteus minimus insertion. Remainder of the gluteal tendons and muscles are normal. Hamstrings: Normal. Other findings Miscellaneous: No pelvic free fluid. No fluid collection or hematoma. No inguinal lymphadenopathy. No inguinal hernia. IMPRESSION: 1. Chronic avascular necrosis of bilateral hips, right greater than left. No articular surface collapse or surrounding marrow edema. 2. Moderate osteoarthritis of bilateral hips, right worse than left. Mild right hip synovitis. 3. Superior anterior right labral degeneration. Electronically Signed   By: Elige Ko   On: 08/06/2016 10:51   Dg Hip Unilat W Or W/o Pelvis 2-3 Views Right  Result Date: 07/30/2016 CLINICAL DATA:  Worsening right hip pain, no known injury EXAM: DG HIP (WITH OR WITHOUT PELVIS) 2-3V RIGHT COMPARISON:  CT scan 12/20/2011 FINDINGS: Two views of the right hip submitted. Again noted  mild serpiginous subchondral sclerotic changes bilateral femoral head consistent with avascular necrosis. There is diffuse mild narrowing of right hip joint space. Mild spurring of right acetabulum. There is mild spurring bilateral greater femoral trochanter. No acute fracture or subluxation. No definite remodeling of femoral head. IMPRESSION: No acute fracture or subluxation. Osteoarthritic changes bilateral hip joints as described above. Again noted mild serpiginous sclerotic changes bilateral femoral head consistent with avascular necrosis. No definite remodeling of femoral head. Electronically Signed   By: Natasha Mead M.D.   On: 07/30/2016 11:04   No additional findings.   ASSESSMENT & PLAN:  Visit Diagnoses:  1. Right hip pain   2. Chronic right shoulder pain   3. Right hand pain   4. Facet arthritis of cervical region (HCC)   5. Facet arthritis of lumbar region (HCC)   6. Essential hypertension   7. Primary osteoarthritis of both hips   8. Polyarthralgia   9. Avascular necrosis of right femur (HCC)    Meds:  Meds ordered this encounter  Medications  . traMADol (ULTRAM) 50 MG tablet    Sig: Take 1 tablet (50 mg total) by mouth every 6 (six) hours as needed for moderate pain.    Dispense:  30 tablet    Refill:  0  . gabapentin (NEURONTIN) 100 MG capsule    Sig: Take 1 capsule (  100 mg total) by mouth 3 (three) times daily.    Dispense:  60 capsule    Refill:  1    Orders:  Orders Placed This Encounter  Procedures  . Ambulatory referral to Orthopedic Surgery    Follow-up: Return in about 2 weeks (around 09/02/2016).  Otherwise please see problem oriented charting as below.  CMA/ATC served as Neurosurgeon during this visit. History, Physical, and Plan performed by medical provider. Documentation and orders reviewed and attested to.      Gaspar Bidding, DO    [place] [facility] Physician    08/20/2016 6:22 AM

## 2016-08-20 ENCOUNTER — Other Ambulatory Visit: Payer: Self-pay

## 2016-08-20 MED ORDER — LOSARTAN POTASSIUM 50 MG PO TABS: ORAL_TABLET | ORAL | 3 refills | Status: DC

## 2016-08-20 MED ORDER — GEMFIBROZIL 600 MG PO TABS
ORAL_TABLET | ORAL | Status: DC
Start: 2016-08-20 — End: 2022-10-04

## 2016-08-20 NOTE — Assessment & Plan Note (Signed)
Patient does have some underlying arthritis of her low back that has benefited from Neurontin and tramadol.  We continued at this time.  We did discuss that total hip arthroplasty will likely not completely the alleviate all of her symptoms the majority of her leg and groin pain should be helpful

## 2016-08-20 NOTE — Assessment & Plan Note (Signed)
Suspect she has some underlying cervical radiculitis and we are awaiting nerve conduction studies from [facility].  These have previously been requested and were requested once again today with paperwork being completed by the patient here in the office.  If unrevealing or unavailable, MRI of the cervical spine will be recommended.

## 2016-08-20 NOTE — Assessment & Plan Note (Signed)
Chronic long-standing AVN with associated significant pain and antalgic gait.  With her underlying osteoarthritis surgical intervention that the most beneficial would be a total hip arthroplasty.  Referral placed to [facility] orthopedics

## 2016-08-20 NOTE — Assessment & Plan Note (Signed)
See above.  Right is more symptomatic than left

## 2016-08-20 NOTE — Assessment & Plan Note (Signed)
Autoimmune workup unrevealing at last office visit.

## 2016-08-28 ENCOUNTER — Ambulatory Visit (INDEPENDENT_AMBULATORY_CARE_PROVIDER_SITE_OTHER): Payer: BLUE CROSS/BLUE SHIELD | Admitting: Orthopaedic Surgery

## 2016-08-28 ENCOUNTER — Telehealth: Payer: Self-pay | Admitting: Sports Medicine

## 2016-08-28 ENCOUNTER — Encounter (INDEPENDENT_AMBULATORY_CARE_PROVIDER_SITE_OTHER): Payer: Self-pay | Admitting: Orthopaedic Surgery

## 2016-08-28 VITALS — Ht 70.0 in | Wt 186.0 lb

## 2016-08-28 DIAGNOSIS — M87051 Idiopathic aseptic necrosis of right femur: Secondary | ICD-10-CM

## 2016-08-28 DIAGNOSIS — M160 Bilateral primary osteoarthritis of hip: Secondary | ICD-10-CM | POA: Diagnosis not present

## 2016-08-28 NOTE — Telephone Encounter (Signed)
ROI faxed to Dr. Barbette MerinoShawn Bethea.

## 2016-08-28 NOTE — Telephone Encounter (Signed)
ROI faxed to Dr. Darnell LevelSlade Moore

## 2016-08-28 NOTE — Progress Notes (Signed)
Office Visit Note   Patient: [name]           Date of Birth: 1956/03/21           MRN: [id number] Visit Date: 08/28/2016              Requested by: Andrena Mews, DO [address] [place], [place] [address] PCP: April Manson, NP   Assessment & Plan: Visit Diagnoses:  1. Avascular necrosis of right femur (HCC)   2. Primary osteoarthritis of both hips     Plan: We had a long and thorough discussion about her hip. Both hips are bothering her but the right hip is much worse in the left. She has truly more of osteoarthritis superimposed with avascular necrosis as well. Regardless her pain is daily. It is detrimentally affected her activities daily living, her quality of life, and her mobility. We long and thorough discussion about hip replacement surgery as well as discussion of the risk and benefits of surgery. I showed her hip models and explained in detail what the surgery involves including a discussion of her intraoperative and postoperative course. All questions were encouraged and answered. She does wish to pursue surgery in the near future. We would then see her back in 2 weeks postoperative but no x-rays should be needed.  Follow-Up Instructions: Return for 2 weeks post-op.   Orders:  No orders of the defined types were placed in this encounter.  No orders of the defined types were placed in this encounter.     Procedures: No procedures performed   Clinical Data: No additional findings.   Subjective: Chief Complaint  Patient presents with  . Right Hip - Pain    Referral from Dr. Berline Chough. AVN right hip, groin pain, chronic ongoing over a year. Here to discuss THA.   The patient has MRI that accompanies her. Her pain is daily. It is 10 out of 10. It's effect direct is daily living, her mobility, and her quality of life. She is tried and failed all forms conservative treatment. His pain is really worsening of her last year is been hurting really actually  more than a year. She's had physical therapy as well as intra-articular injection that helped minimally. She's had at least 2 injections. She tries offload her hip is much is possible to work on activity modification as well. She is referred to me to assess her for the potential of a total hip replacement. HPI  Review of Systems She currently denies any headache, chest pain, shortness of breath, fever, chills, nausea, vomiting.  Objective: Vital Signs: Ht 5\' 10"  (1.778 m)   Wt 186 lb (84.4 kg)   BMI 26.69 kg/m   Physical Exam She is alert and oriented 3 and in no acute distress Ortho Exam Examination of her right and left hip show pain on internal rotation rotation but no blocks to rotation. The pain is quite significant. It's all in the groin area. There is no significant pain over the trochanteric area on either side. Her knee foot and ankle exam showed just some patellofemoral crepitation of the knee itself but otherwise no acute findings. Her back exam shows no acute changes. She does report some back pain though. Specialty Comments:  No specialty comments available.  Imaging: No results found. Images on the cone canopy system and family reviewed do show significant arthritic changes and changes of avascular necrosis of her right hip. There is absolutely some changes on the left  side is well. Avascular necrosis itself appears chronic. There is no significant hip joint effusion but her some mild synovitis of the right hip. There is no articular surface collapse of femoral head but there is definitely areas of significant cartilage changes. The left side appears less significant than the right but it does have some partial cartilage loss.  PMFS History: Patient Active Problem List   Diagnosis Date Noted  . AVN of femur (HCC) 07/30/2016  . Polyarthralgia 07/30/2016  . Chronic right shoulder pain 07/30/2016  . Right hand pain 07/30/2016  . Facet arthritis of lumbar region (HCC)  05/20/2016  . Facet arthritis of cervical region (HCC) 05/20/2016  . Primary osteoarthritis of both hips 12/18/2015  . Chronic bronchitis (HCC) 09/25/2015  . Essential hypertension 09/25/2015  . GAD (generalized anxiety disorder) 09/25/2015  . Gastroesophageal reflux disease without esophagitis 09/25/2015  . Hyperlipemia 09/25/2015  . Type 2 diabetes mellitus with complication (HCC) 09/25/2015   Past Medical History:  Diagnosis Date  . Anxiety and depression   . Asthma    related to seasonal allergies  . Diabetes mellitus with complication (HCC)    microalbuminuria 03/2015  . GERD (gastroesophageal reflux disease)   . Hyperlipidemia, mixed 07/2015  . Hypertension   . Vitamin D deficiency     History reviewed. No pertinent family history.  Past Surgical History:  Procedure Laterality Date  . FOOT SURGERY    . INCONTINENCE SURGERY    . SHOULDER SURGERY    . TONSILLECTOMY     Social History   Occupational History  . Not on file.   Social History Main Topics  . Smoking status: Former Smoker    Types: Cigarettes  . Smokeless tobacco: Never Used  . Alcohol use Yes     Comment: occasional  . Drug use: No  . Sexual activity: Not on file

## 2016-08-30 ENCOUNTER — Telehealth: Payer: Self-pay | Admitting: Sports Medicine

## 2016-08-30 NOTE — Telephone Encounter (Signed)
Rec'd from Dr.Shawn Bethea forward 17 pages to Gaspar Biddingigby Michael DO

## 2016-09-05 ENCOUNTER — Encounter (INDEPENDENT_AMBULATORY_CARE_PROVIDER_SITE_OTHER): Payer: Self-pay | Admitting: Orthopaedic Surgery

## 2016-09-05 ENCOUNTER — Other Ambulatory Visit (INDEPENDENT_AMBULATORY_CARE_PROVIDER_SITE_OTHER): Payer: Self-pay | Admitting: Physician Assistant

## 2016-09-05 ENCOUNTER — Ambulatory Visit (INDEPENDENT_AMBULATORY_CARE_PROVIDER_SITE_OTHER): Payer: No Typology Code available for payment source | Admitting: Orthopaedic Surgery

## 2016-09-05 NOTE — Progress Notes (Signed)
Please place orders in EPIC as patient is being scheduled for a pre-op appointment! Thank you! 

## 2016-09-06 ENCOUNTER — Encounter: Payer: Self-pay | Admitting: Sports Medicine

## 2016-09-13 NOTE — Patient Instructions (Addendum)
[name]  09/13/2016   Your procedure is scheduled on: 09-27-16   Report to [facility] Main  [facility]  elevators to 3rd floor to Short Stay Center at 12:30 PM.   Call this number if you have problems the morning of surgery [phone]    Remember: ONLY 1 PERSON MAY GO WITH YOU TO SHORT STAY TO GET  READY MORNING OF YOUR SURGERY.  Do not eat food or drink liquids :After Midnight. You may have a clear liquid diet from Midnight until 08:30 AM. After 8:30 AM, nothing by mouth     CLEAR LIQUID DIET   Foods Allowed                                                                     Foods Excluded  Coffee and tea, regular and decaf                             liquids that you cannot  Plain Jell-O in any flavor                                             see through such as: Fruit ices (not with fruit pulp)                                     milk, soups, orange juice  Iced Popsicles                                    All solid food Carbonated beverages, regular and diet                                    Cranberry, grape and apple juices Sports drinks like Gatorade Lightly seasoned clear broth or consume(fat free) Sugar, honey syrup  Sample Menu Breakfast                                Lunch                                     Supper Cranberry juice                    Beef broth                            Chicken broth Jell-O                                     Grape juice  Apple juice Coffee or tea                        Jell-O                                      Popsicle                                                Coffee or tea                        Coffee or tea  _____________________________________________________________________     Take these medicines the morning of surgery with A SIP OF WATER: Amlodipine (Norvasc), Atenolol (Tenormin), Omeprazole (Prilosec), Sertraline (Zoloft).  You may also bring and use  any eyedrops or inhaler that you might need.   DO NOT TAKE ANY DIABETIC MEDICATIONS DAY OF YOUR SURGERY                               You may not have any metal on your body including hair pins and              piercings  Do not wear jewelry, make-up, lotions, powders or perfumes, deodorant             Do not wear nail polish.  Do not shave  48 hours prior to surgery.              .   Do not bring valuables to the hospital. [facility] IS NOT             RESPONSIBLE   FOR VALUABLES.  Contacts, dentures or bridgework may not be worn into surgery.  Leave suitcase in the car. After surgery it may be brought to your room.     How to Manage Your Diabetes Before and After Surgery  Why is it important to control my blood sugar before and after surgery? . Improving blood sugar levels before and after surgery helps healing and can limit problems. . A way of improving blood sugar control is eating a healthy diet by: o  Eating less sugar and carbohydrates o  Increasing activity/exercise o  Talking with your doctor about reaching your blood sugar goals . High blood sugars (greater than 180 mg/dL) can raise your risk of infections and slow your recovery, so you will need to focus on controlling your diabetes during the weeks before surgery. . Make sure that the doctor who takes care of your diabetes knows about your planned surgery including the date and location.  How do I manage my blood sugar before surgery? . Check your blood sugar at least 4 times a day, starting 2 days before surgery, to make sure that the level is not too high or low. o Check your blood sugar the morning of your surgery when you wake up and every 2 hours until you get to the Short Stay unit. . If your blood sugar is less than 70 mg/dL, you will need to treat for low blood sugar: o Do not take insulin. o Treat a low blood sugar (less than 70 mg/dL) with  cup of clear juice (  cranberry or apple), 4 glucose tablets, OR glucose  gel. o Recheck blood sugar in 15 minutes after treatment (to make sure it is greater than 70 mg/dL). If your blood sugar is not greater than 70 mg/dL on recheck, call [phone] for further instructions. . Report your blood sugar to the short stay nurse when you get to Short Stay.  . If you are admitted to the hospital after surgery: o Your blood sugar will be checked by the staff and you will probably be given insulin after surgery (instead of oral diabetes medicines) to make sure you have good blood sugar levels. o The goal for blood sugar control after surgery is 80-180 mg/dL.   WHAT DO I DO ABOUT MY DIABETES MEDICATION?  Marland Kitchen Do not take oral diabetes medicines (pills) the morning of surgery.  . THE DAY BEFORE SURGERY, take your usual dose of Metformin         Patient Signature:  Date:   Nurse Signature:  Date:   Reviewed and Endorsed by [facility] Patient Education Committee, August 2015  [facility] - Preparing for Surgery Before surgery, you can play an important role.  Because skin is not sterile, your skin needs to be as free of germs as possible.  You can reduce the number of germs on your skin by washing with CHG (chlorahexidine gluconate) soap before surgery.  CHG is an antiseptic cleaner which kills germs and bonds with the skin to continue killing germs even after washing. Please DO NOT use if you have an allergy to CHG or antibacterial soaps.  If your skin becomes reddened/irritated stop using the CHG and inform your nurse when you arrive at Short Stay. Do not shave (including legs and underarms) for at least 48 hours prior to the first CHG shower.  You may shave your face/neck. Please follow these instructions carefully:  1.  Shower with CHG Soap the night before surgery and the  morning of Surgery.  2.  If you choose to wash your hair, wash your hair first as usual with your  normal  shampoo.  3.  After you shampoo, rinse your hair and body thoroughly to remove the   shampoo.                           4.  Use CHG as you would any other liquid soap.  You can apply chg directly  to the skin and wash                       Gently with a scrungie or clean washcloth.  5.  Apply the CHG Soap to your body ONLY FROM THE NECK DOWN.   Do not use on face/ open                           Wound or open sores. Avoid contact with eyes, ears mouth and genitals (private parts).                       Wash face,  Genitals (private parts) with your normal soap.             6.  Wash thoroughly, paying special attention to the area where your surgery  will be performed.  7.  Thoroughly rinse your body with warm water from the neck down.  8.  DO NOT shower/wash with your  normal soap after using and rinsing off  the CHG Soap.                9.  Pat yourself dry with a clean towel.            10.  Wear clean pajamas.            11.  Place clean sheets on your bed the night of your first shower and do not  sleep with pets. Day of Surgery : Do not apply any lotions/deodorants the morning of surgery.  Please wear clean clothes to the hospital/surgery center.  FAILURE TO FOLLOW THESE INSTRUCTIONS MAY RESULT IN THE CANCELLATION OF YOUR SURGERY PATIENT SIGNATURE_________________________________  NURSE SIGNATURE__________________________________  ________________________________________________________________________    Rogelia Mire  An incentive spirometer is a tool that can help keep your lungs clear and active. This tool measures how well you are filling your lungs with each breath. Taking long deep breaths may help reverse or decrease the chance of developing breathing (pulmonary) problems (especially infection) following:  A long period of time when you are unable to move or be active. BEFORE THE PROCEDURE   If the spirometer includes an indicator to show your best effort, your nurse or respiratory therapist will set it to a desired goal.  If possible, sit up  straight or lean slightly forward. Try not to slouch.  Hold the incentive spirometer in an upright position. INSTRUCTIONS FOR USE  1. Sit on the edge of your bed if possible, or sit up as far as you can in bed or on a chair. 2. Hold the incentive spirometer in an upright position. 3. Breathe out normally. 4. Place the mouthpiece in your mouth and seal your lips tightly around it. 5. Breathe in slowly and as deeply as possible, raising the piston or the ball toward the top of the column. 6. Hold your breath for 3-5 seconds or for as long as possible. Allow the piston or ball to fall to the bottom of the column. 7. Remove the mouthpiece from your mouth and breathe out normally. 8. Rest for a few seconds and repeat Steps 1 through 7 at least 10 times every 1-2 hours when you are awake. Take your time and take a few normal breaths between deep breaths. 9. The spirometer may include an indicator to show your best effort. Use the indicator as a goal to work toward during each repetition. 10. After each set of 10 deep breaths, practice coughing to be sure your lungs are clear. If you have an incision (the cut made at the time of surgery), support your incision when coughing by placing a pillow or rolled up towels firmly against it. Once you are able to get out of bed, walk around indoors and cough well. You may stop using the incentive spirometer when instructed by your caregiver.  RISKS AND COMPLICATIONS  Take your time so you do not get dizzy or light-headed.  If you are in pain, you may need to take or ask for pain medication before doing incentive spirometry. It is harder to take a deep breath if you are having pain. AFTER USE  Rest and breathe slowly and easily.  It can be helpful to keep track of a log of your progress. Your caregiver can provide you with a simple table to help with this. If you are using the spirometer at home, follow these instructions: SEEK MEDICAL CARE IF:   You are  having difficultly using the  spirometer.  You have trouble using the spirometer as often as instructed.  Your pain medication is not giving enough relief while using the spirometer.  You develop fever of 100.5 F (38.1 C) or higher. SEEK IMMEDIATE MEDICAL CARE IF:   You cough up bloody sputum that had not been present before.  You develop fever of 102 F (38.9 C) or greater.  You develop worsening pain at or near the incision site. MAKE SURE YOU:   Understand these instructions.  Will watch your condition.  Will get help right away if you are not doing well or get worse. Document Released: 08/19/2006 Document Revised: 07/01/2011 Document Reviewed: 10/20/2006 ExitCare Patient Information 2014 ExitCare, [place].   ________________________________________________________________________  WHAT IS A BLOOD TRANSFUSION? Blood Transfusion Information  A transfusion is the replacement of blood or some of its parts. Blood is made up of multiple cells which provide different functions.  Red blood cells carry oxygen and are used for blood loss replacement.  White blood cells fight against infection.  Platelets control bleeding.  Plasma helps clot blood.  Other blood products are available for specialized needs, such as hemophilia or other clotting disorders. BEFORE THE TRANSFUSION  Who gives blood for transfusions?   Healthy volunteers who are fully evaluated to make sure their blood is safe. This is blood bank blood. Transfusion therapy is the safest it has ever been in the practice of medicine. Before blood is taken from a donor, a complete history is taken to make sure that person has no history of diseases nor engages in risky social behavior (examples are intravenous drug use or sexual activity with multiple partners). The donor's travel history is screened to minimize risk of transmitting infections, such as malaria. The donated blood is tested for signs of infectious diseases,  such as HIV and hepatitis. The blood is then tested to be sure it is compatible with you in order to minimize the chance of a transfusion reaction. If you or a relative donates blood, this is often done in anticipation of surgery and is not appropriate for emergency situations. It takes many days to process the donated blood. RISKS AND COMPLICATIONS Although transfusion therapy is very safe and saves many lives, the main dangers of transfusion include:   Getting an infectious disease.  Developing a transfusion reaction. This is an allergic reaction to something in the blood you were given. Every precaution is taken to prevent this. The decision to have a blood transfusion has been considered carefully by your caregiver before blood is given. Blood is not given unless the benefits outweigh the risks. AFTER THE TRANSFUSION  Right after receiving a blood transfusion, you will usually feel much better and more energetic. This is especially true if your red blood cells have gotten low (anemic). The transfusion raises the level of the red blood cells which carry oxygen, and this usually causes an energy increase.  The nurse administering the transfusion will monitor you carefully for complications. HOME CARE INSTRUCTIONS  No special instructions are needed after a transfusion. You may find your energy is better. Speak with your caregiver about any limitations on activity for underlying diseases you may have. SEEK MEDICAL CARE IF:   Your condition is not improving after your transfusion.  You develop redness or irritation at the intravenous (IV) site. SEEK IMMEDIATE MEDICAL CARE IF:  Any of the following symptoms occur over the next 12 hours:  Shaking chills.  You have a temperature by mouth above 102 F (38.9 C),  not controlled by medicine.  Chest, back, or muscle pain.  People around you feel you are not acting correctly or are confused.  Shortness of breath or difficulty  breathing.  Dizziness and fainting.  You get a rash or develop hives.  You have a decrease in urine output.  Your urine turns a dark color or changes to pink, red, or brown. Any of the following symptoms occur over the next 10 days:  You have a temperature by mouth above 102 F (38.9 C), not controlled by medicine.  Shortness of breath.  Weakness after normal activity.  The white part of the eye turns yellow (jaundice).  You have a decrease in the amount of urine or are urinating less often.  Your urine turns a dark color or changes to pink, red, or brown. Document Released: 04/05/2000 Document Revised: 07/01/2011 Document Reviewed: 11/23/2007 ExitCare Patient Information 2014 ExitCare, [place].  _______________________________________________________________________Please read over the following fact sheets you were given: _____________________________________________________________________

## 2016-09-18 ENCOUNTER — Encounter (HOSPITAL_COMMUNITY)
Admission: RE | Admit: 2016-09-18 | Discharge: 2016-09-18 | Disposition: A | Discharge status: Home / Self Care | Payer: BLUE CROSS/BLUE SHIELD | Source: Ambulatory Visit | Attending: Orthopaedic Surgery | Admitting: Orthopaedic Surgery

## 2016-09-18 ENCOUNTER — Encounter (HOSPITAL_COMMUNITY): Payer: Self-pay

## 2016-09-18 DIAGNOSIS — I10 Essential (primary) hypertension: Secondary | ICD-10-CM | POA: Insufficient documentation

## 2016-09-18 DIAGNOSIS — Z01818 Encounter for other preprocedural examination: Secondary | ICD-10-CM | POA: Diagnosis present

## 2016-09-18 DIAGNOSIS — Z01812 Encounter for preprocedural laboratory examination: Secondary | ICD-10-CM | POA: Diagnosis not present

## 2016-09-18 DIAGNOSIS — E119 Type 2 diabetes mellitus without complications: Secondary | ICD-10-CM | POA: Insufficient documentation

## 2016-09-18 DIAGNOSIS — M87051 Idiopathic aseptic necrosis of right femur: Secondary | ICD-10-CM | POA: Diagnosis not present

## 2016-09-18 DIAGNOSIS — M1611 Unilateral primary osteoarthritis, right hip: Secondary | ICD-10-CM | POA: Diagnosis not present

## 2016-09-18 DIAGNOSIS — Z01810 Encounter for preprocedural cardiovascular examination: Secondary | ICD-10-CM | POA: Insufficient documentation

## 2016-09-18 LAB — ABO/RH: ABO/RH(D): O POS

## 2016-09-18 LAB — BASIC METABOLIC PANEL
Anion gap: 7 (ref 5–15)
BUN: 9 mg/dL (ref 6–20)
CHLORIDE: 104 mmol/L (ref 101–111)
CO2: 28 mmol/L (ref 22–32)
CREATININE: 0.61 mg/dL (ref 0.44–1.00)
Calcium: 9.4 mg/dL (ref 8.9–10.3)
GFR calc non Af Amer: >60 mL/min (ref >60)
Glucose, Bld: 136 mg/dL — ABNORMAL HIGH (ref 65–99)
POTASSIUM: 4.9 mmol/L (ref 3.5–5.1)
Sodium: 139 mmol/L (ref 135–145)

## 2016-09-18 LAB — CBC
HEMATOCRIT: 35.1 % — AB (ref 36.0–46.0)
HEMOGLOBIN: 11.5 g/dL — AB (ref 12.0–15.0)
MCH: 28.2 pg (ref 26.0–34.0)
MCHC: 32.8 g/dL (ref 30.0–36.0)
MCV: 86 fL (ref 78.0–100.0)
PLATELETS: 264 10*3/uL (ref 150–400)
RBC: 4.08 MIL/uL (ref 3.87–5.11)
RDW: 12.6 % (ref 11.5–15.5)
WBC: 4.8 10*3/uL (ref 4.0–10.5)

## 2016-09-18 LAB — GLUCOSE, CAPILLARY: GLUCOSE-CAPILLARY: 148 mg/dL — AB (ref 65–99)

## 2016-09-18 LAB — SURGICAL PCR SCREEN
MRSA, PCR: NEGATIVE
STAPHYLOCOCCUS AUREUS: NEGATIVE

## 2016-09-19 LAB — HEMOGLOBIN A1C
Hgb A1c MFr Bld: 6.4 % — ABNORMAL HIGH (ref 4.8–5.6)
MEAN PLASMA GLUCOSE: 137 mg/dL

## 2016-09-24 ENCOUNTER — Encounter: Payer: Self-pay | Admitting: Sports Medicine

## 2016-09-24 ENCOUNTER — Encounter (INDEPENDENT_AMBULATORY_CARE_PROVIDER_SITE_OTHER): Payer: Self-pay | Admitting: Orthopaedic Surgery

## 2016-09-27 ENCOUNTER — Inpatient Hospital Stay (HOSPITAL_COMMUNITY): Payer: BLUE CROSS/BLUE SHIELD | Admitting: Anesthesiology

## 2016-09-27 ENCOUNTER — Inpatient Hospital Stay (HOSPITAL_COMMUNITY): Payer: BLUE CROSS/BLUE SHIELD

## 2016-09-27 ENCOUNTER — Encounter (HOSPITAL_COMMUNITY): Payer: Self-pay | Admitting: *Deleted

## 2016-09-27 ENCOUNTER — Encounter (HOSPITAL_COMMUNITY)
Admission: RE | Disposition: A | Discharge status: Home Health | Payer: Self-pay | Source: Ambulatory Visit | Attending: Orthopaedic Surgery

## 2016-09-27 ENCOUNTER — Inpatient Hospital Stay (HOSPITAL_COMMUNITY)
Admission: RE | Admit: 2016-09-27 | Discharge: 2016-09-29 | DRG: 470 | Disposition: A | Discharge status: Home Health | Payer: BLUE CROSS/BLUE SHIELD | Source: Ambulatory Visit | Attending: Orthopaedic Surgery | Admitting: Orthopaedic Surgery

## 2016-09-27 DIAGNOSIS — E782 Mixed hyperlipidemia: Secondary | ICD-10-CM | POA: Diagnosis present

## 2016-09-27 DIAGNOSIS — Z96641 Presence of right artificial hip joint: Secondary | ICD-10-CM

## 2016-09-27 DIAGNOSIS — Z7984 Long term (current) use of oral hypoglycemic drugs: Secondary | ICD-10-CM | POA: Diagnosis not present

## 2016-09-27 DIAGNOSIS — M879 Osteonecrosis, unspecified: Secondary | ICD-10-CM | POA: Diagnosis present

## 2016-09-27 DIAGNOSIS — F411 Generalized anxiety disorder: Secondary | ICD-10-CM | POA: Diagnosis present

## 2016-09-27 DIAGNOSIS — K219 Gastro-esophageal reflux disease without esophagitis: Secondary | ICD-10-CM | POA: Diagnosis present

## 2016-09-27 DIAGNOSIS — E118 Type 2 diabetes mellitus with unspecified complications: Secondary | ICD-10-CM | POA: Diagnosis present

## 2016-09-27 DIAGNOSIS — I10 Essential (primary) hypertension: Secondary | ICD-10-CM | POA: Diagnosis present

## 2016-09-27 DIAGNOSIS — D62 Acute posthemorrhagic anemia: Secondary | ICD-10-CM | POA: Diagnosis not present

## 2016-09-27 DIAGNOSIS — Z79899 Other long term (current) drug therapy: Secondary | ICD-10-CM

## 2016-09-27 DIAGNOSIS — M25551 Pain in right hip: Secondary | ICD-10-CM | POA: Diagnosis present

## 2016-09-27 DIAGNOSIS — Z880 Allergy status to penicillin: Secondary | ICD-10-CM

## 2016-09-27 DIAGNOSIS — M87051 Idiopathic aseptic necrosis of right femur: Secondary | ICD-10-CM

## 2016-09-27 DIAGNOSIS — J45909 Unspecified asthma, uncomplicated: Secondary | ICD-10-CM | POA: Diagnosis present

## 2016-09-27 DIAGNOSIS — Z419 Encounter for procedure for purposes other than remedying health state, unspecified: Secondary | ICD-10-CM

## 2016-09-27 DIAGNOSIS — Z7983 Long term (current) use of bisphosphonates: Secondary | ICD-10-CM

## 2016-09-27 DIAGNOSIS — Z87891 Personal history of nicotine dependence: Secondary | ICD-10-CM | POA: Diagnosis not present

## 2016-09-27 HISTORY — PX: TOTAL HIP ARTHROPLASTY: SHX124

## 2016-09-27 HISTORY — DX: Idiopathic aseptic necrosis of right femur: M87.051

## 2016-09-27 LAB — GLUCOSE, CAPILLARY
GLUCOSE-CAPILLARY: 90 mg/dL (ref 65–99)
GLUCOSE-CAPILLARY: 149 mg/dL — AB (ref 65–99)
Glucose-Capillary: 106 mg/dL — ABNORMAL HIGH (ref 65–99)

## 2016-09-27 SURGERY — ARTHROPLASTY, HIP, TOTAL, ANTERIOR APPROACH
Anesthesia: Spinal | Site: Hip | Laterality: Right

## 2016-09-27 MED ORDER — PANTOPRAZOLE SODIUM 40 MG PO TBEC
40.0000 mg | DELAYED_RELEASE_TABLET | Freq: Every day | ORAL | Status: DC
  Administered 2016-09-28 – 2016-09-29 (×2): 40 mg via ORAL
  Filled 2016-09-27 (×2): qty 1

## 2016-09-27 MED ORDER — METHOCARBAMOL 1000 MG/10ML IJ SOLN
500.0000 mg | Freq: Four times a day (QID) | INTRAVENOUS | Status: DC | PRN
  Administered 2016-09-27 (×2): 500 mg via INTRAVENOUS
  Filled 2016-09-27 (×2): qty 550

## 2016-09-27 MED ORDER — PROPOFOL 10 MG/ML IV BOLUS
INTRAVENOUS | Status: DC | PRN
  Administered 2016-09-27: 20 mg via INTRAVENOUS
  Administered 2016-09-27: 40 mg via INTRAVENOUS

## 2016-09-27 MED ORDER — FENTANYL CITRATE (PF) 100 MCG/2ML IJ SOLN
INTRAMUSCULAR | Status: AC
  Filled 2016-09-27: qty 2

## 2016-09-27 MED ORDER — HYDROMORPHONE HCL 1 MG/ML IJ SOLN
INTRAMUSCULAR | Status: AC
  Filled 2016-09-27: qty 1

## 2016-09-27 MED ORDER — AMLODIPINE BESYLATE 10 MG PO TABS
10.0000 mg | ORAL_TABLET | Freq: Every day | ORAL | Status: DC
  Administered 2016-09-29: 10 mg via ORAL
  Filled 2016-09-27: qty 1

## 2016-09-27 MED ORDER — PHENYLEPHRINE HCL 10 MG/ML IJ SOLN
INTRAVENOUS | Status: DC | PRN
  Administered 2016-09-27: 40 ug/min via INTRAVENOUS

## 2016-09-27 MED ORDER — CLINDAMYCIN PHOSPHATE 600 MG/50ML IV SOLN
600.0000 mg | Freq: Four times a day (QID) | INTRAVENOUS | Status: AC
  Administered 2016-09-27 – 2016-09-28 (×2): 600 mg via INTRAVENOUS
  Filled 2016-09-27 (×2): qty 50

## 2016-09-27 MED ORDER — 0.9 % SODIUM CHLORIDE (POUR BTL) OPTIME
TOPICAL | Status: DC | PRN
  Administered 2016-09-27: 1000 mL

## 2016-09-27 MED ORDER — ALPRAZOLAM 1 MG PO TABS
1.0000 mg | ORAL_TABLET | Freq: Three times a day (TID) | ORAL | Status: DC | PRN
  Administered 2016-09-27 – 2016-09-29 (×5): 1 mg via ORAL
  Filled 2016-09-27 (×5): qty 1

## 2016-09-27 MED ORDER — TRANEXAMIC ACID 1000 MG/10ML IV SOLN
1000.0000 mg | INTRAVENOUS | Status: AC
  Administered 2016-09-27: 1000 mg via INTRAVENOUS
  Filled 2016-09-27: qty 1100

## 2016-09-27 MED ORDER — METHOCARBAMOL 500 MG PO TABS
500.0000 mg | ORAL_TABLET | Freq: Four times a day (QID) | ORAL | Status: DC | PRN
  Administered 2016-09-28 – 2016-09-29 (×3): 500 mg via ORAL
  Filled 2016-09-27 (×3): qty 1

## 2016-09-27 MED ORDER — MIDAZOLAM HCL 2 MG/2ML IJ SOLN
INTRAMUSCULAR | Status: AC
  Filled 2016-09-27: qty 2

## 2016-09-27 MED ORDER — SODIUM CHLORIDE 0.9 % IR SOLN
Status: DC | PRN
  Administered 2016-09-27: 1000 mL

## 2016-09-27 MED ORDER — ALUM & MAG HYDROXIDE-SIMETH 200-200-20 MG/5ML PO SUSP: 30.0000 mL | ORAL | Status: DC | PRN

## 2016-09-27 MED ORDER — ONDANSETRON HCL 4 MG/2ML IJ SOLN: 4.0000 mg | Freq: Four times a day (QID) | INTRAMUSCULAR | Status: DC | PRN

## 2016-09-27 MED ORDER — MIDAZOLAM HCL 5 MG/5ML IJ SOLN
INTRAMUSCULAR | Status: DC | PRN
  Administered 2016-09-27: 2 mg via INTRAVENOUS

## 2016-09-27 MED ORDER — CYCLOSPORINE 0.05 % OP EMUL
1.0000 [drp] | Freq: Every day | OPHTHALMIC | Status: DC
  Administered 2016-09-27 – 2016-09-28 (×2): 1 [drp] via OPHTHALMIC
  Filled 2016-09-27 (×2): qty 1

## 2016-09-27 MED ORDER — MENTHOL 3 MG MT LOZG: 1.0000 | LOZENGE | OROMUCOSAL | Status: DC | PRN

## 2016-09-27 MED ORDER — SODIUM CHLORIDE 0.9 % IV SOLN
INTRAVENOUS | Status: DC
  Administered 2016-09-27 – 2016-09-28 (×2): via INTRAVENOUS

## 2016-09-27 MED ORDER — DIPHENHYDRAMINE HCL 12.5 MG/5ML PO ELIX: 12.5000 mg | ORAL_SOLUTION | ORAL | Status: DC | PRN

## 2016-09-27 MED ORDER — ALBUTEROL SULFATE (2.5 MG/3ML) 0.083% IN NEBU: 3.0000 mL | INHALATION_SOLUTION | Freq: Four times a day (QID) | RESPIRATORY_TRACT | Status: DC | PRN

## 2016-09-27 MED ORDER — ONDANSETRON HCL 4 MG/2ML IJ SOLN
INTRAMUSCULAR | Status: AC
  Filled 2016-09-27: qty 2

## 2016-09-27 MED ORDER — CHLORHEXIDINE GLUCONATE 4 % EX LIQD: 60.0000 mL | Freq: Once | CUTANEOUS | Status: DC

## 2016-09-27 MED ORDER — GEMFIBROZIL 600 MG PO TABS
300.0000 mg | ORAL_TABLET | Freq: Two times a day (BID) | ORAL | Status: DC
  Administered 2016-09-28 – 2016-09-29 (×3): 300 mg via ORAL
  Filled 2016-09-27 (×4): qty 0.5

## 2016-09-27 MED ORDER — ATENOLOL 50 MG PO TABS
50.0000 mg | ORAL_TABLET | Freq: Every day | ORAL | Status: DC
  Administered 2016-09-28 – 2016-09-29 (×2): 50 mg via ORAL
  Filled 2016-09-27 (×2): qty 1

## 2016-09-27 MED ORDER — BUPIVACAINE IN DEXTROSE 0.75-8.25 % IT SOLN
INTRATHECAL | Status: DC | PRN
  Administered 2016-09-27: 2 mL via INTRATHECAL

## 2016-09-27 MED ORDER — PROPOFOL 10 MG/ML IV BOLUS
INTRAVENOUS | Status: AC
  Filled 2016-09-27: qty 60

## 2016-09-27 MED ORDER — HYDROMORPHONE HCL 1 MG/ML IJ SOLN
1.0000 mg | INTRAMUSCULAR | Status: DC | PRN
  Administered 2016-09-27 – 2016-09-28 (×2): 1 mg via INTRAVENOUS
  Filled 2016-09-27 (×2): qty 1

## 2016-09-27 MED ORDER — PHENYLEPHRINE HCL 10 MG/ML IJ SOLN: 30.0000 ug/min | INTRAVENOUS | Status: DC

## 2016-09-27 MED ORDER — INSULIN ASPART 100 UNIT/ML ~~LOC~~ SOLN: 0.0000 [IU] | Freq: Three times a day (TID) | SUBCUTANEOUS | Status: DC

## 2016-09-27 MED ORDER — METOCLOPRAMIDE HCL 5 MG PO TABS: 5.0000 mg | ORAL_TABLET | Freq: Three times a day (TID) | ORAL | Status: DC | PRN

## 2016-09-27 MED ORDER — CHLORHEXIDINE GLUCONATE 4 % EX LIQD: Freq: Once | CUTANEOUS | Status: DC

## 2016-09-27 MED ORDER — FENTANYL CITRATE (PF) 100 MCG/2ML IJ SOLN
INTRAMUSCULAR | Status: DC | PRN
  Administered 2016-09-27 (×2): 50 ug via INTRAVENOUS

## 2016-09-27 MED ORDER — KETOROLAC TROMETHAMINE 30 MG/ML IJ SOLN: 30.0000 mg | Freq: Once | INTRAMUSCULAR | Status: DC | PRN

## 2016-09-27 MED ORDER — ONDANSETRON HCL 4 MG PO TABS: 4.0000 mg | ORAL_TABLET | Freq: Four times a day (QID) | ORAL | Status: DC | PRN

## 2016-09-27 MED ORDER — ACETAMINOPHEN 325 MG PO TABS: 650.0000 mg | ORAL_TABLET | Freq: Four times a day (QID) | ORAL | Status: DC | PRN

## 2016-09-27 MED ORDER — ASPIRIN 81 MG PO CHEW
81.0000 mg | CHEWABLE_TABLET | Freq: Two times a day (BID) | ORAL | Status: DC
  Administered 2016-09-27 – 2016-09-29 (×4): 81 mg via ORAL
  Filled 2016-09-27 (×4): qty 1

## 2016-09-27 MED ORDER — PROMETHAZINE HCL 25 MG/ML IJ SOLN: 6.2500 mg | INTRAMUSCULAR | Status: DC | PRN

## 2016-09-27 MED ORDER — PROPOFOL 500 MG/50ML IV EMUL
INTRAVENOUS | Status: DC | PRN
  Administered 2016-09-27: 100 ug/kg/min via INTRAVENOUS

## 2016-09-27 MED ORDER — METFORMIN HCL 500 MG PO TABS
250.0000 mg | ORAL_TABLET | Freq: Two times a day (BID) | ORAL | Status: DC
  Administered 2016-09-28 – 2016-09-29 (×3): 250 mg via ORAL
  Filled 2016-09-27 (×3): qty 1

## 2016-09-27 MED ORDER — LOSARTAN POTASSIUM 25 MG PO TABS
25.0000 mg | ORAL_TABLET | Freq: Every day | ORAL | Status: DC
  Administered 2016-09-29: 25 mg via ORAL
  Filled 2016-09-27: qty 1

## 2016-09-27 MED ORDER — MEPERIDINE HCL 50 MG/ML IJ SOLN: 6.2500 mg | INTRAMUSCULAR | Status: DC | PRN

## 2016-09-27 MED ORDER — ACETAMINOPHEN 650 MG RE SUPP: 650.0000 mg | Freq: Four times a day (QID) | RECTAL | Status: DC | PRN

## 2016-09-27 MED ORDER — HYDROMORPHONE HCL 1 MG/ML IJ SOLN
0.2500 mg | INTRAMUSCULAR | Status: DC | PRN
  Administered 2016-09-27 (×4): 0.5 mg via INTRAVENOUS

## 2016-09-27 MED ORDER — PHENYLEPHRINE HCL 10 MG/ML IJ SOLN
INTRAMUSCULAR | Status: AC
  Filled 2016-09-27: qty 1

## 2016-09-27 MED ORDER — STERILE WATER FOR IRRIGATION IR SOLN
Status: DC | PRN
  Administered 2016-09-27: 2000 mL

## 2016-09-27 MED ORDER — OXYCODONE HCL 5 MG PO TABS
5.0000 mg | ORAL_TABLET | ORAL | Status: DC | PRN
  Administered 2016-09-27: 5 mg via ORAL
  Administered 2016-09-27: 10 mg via ORAL
  Administered 2016-09-27: 5 mg via ORAL
  Administered 2016-09-28 – 2016-09-29 (×9): 10 mg via ORAL
  Filled 2016-09-27 (×7): qty 2
  Filled 2016-09-27: qty 1
  Filled 2016-09-27 (×3): qty 2
  Filled 2016-09-27: qty 1

## 2016-09-27 MED ORDER — PHENOL 1.4 % MT LIQD
1.0000 | OROMUCOSAL | Status: DC | PRN
  Filled 2016-09-27: qty 177

## 2016-09-27 MED ORDER — METOCLOPRAMIDE HCL 5 MG/ML IJ SOLN: 5.0000 mg | Freq: Three times a day (TID) | INTRAMUSCULAR | Status: DC | PRN

## 2016-09-27 MED ORDER — LIDOCAINE 2% (20 MG/ML) 5 ML SYRINGE
INTRAMUSCULAR | Status: AC
  Filled 2016-09-27: qty 5

## 2016-09-27 MED ORDER — LACTATED RINGERS IV SOLN
INTRAVENOUS | Status: DC
  Administered 2016-09-27: 12:00:00 via INTRAVENOUS

## 2016-09-27 MED ORDER — CLINDAMYCIN PHOSPHATE 900 MG/50ML IV SOLN
900.0000 mg | INTRAVENOUS | Status: AC
  Administered 2016-09-27: 900 mg via INTRAVENOUS
  Filled 2016-09-27: qty 50

## 2016-09-27 MED ORDER — ESCITALOPRAM OXALATE 10 MG PO TABS
10.0000 mg | ORAL_TABLET | Freq: Every day | ORAL | Status: DC
  Administered 2016-09-28 – 2016-09-29 (×2): 10 mg via ORAL
  Filled 2016-09-27 (×2): qty 1

## 2016-09-27 MED ORDER — ONDANSETRON HCL 4 MG/2ML IJ SOLN
INTRAMUSCULAR | Status: DC | PRN
  Administered 2016-09-27: 4 mg via INTRAVENOUS

## 2016-09-27 MED ORDER — SERTRALINE HCL 100 MG PO TABS: 100.0000 mg | ORAL_TABLET | Freq: Every day | ORAL | Status: DC

## 2016-09-27 MED ORDER — DOCUSATE SODIUM 100 MG PO CAPS
100.0000 mg | ORAL_CAPSULE | Freq: Two times a day (BID) | ORAL | Status: DC
  Administered 2016-09-27 – 2016-09-29 (×4): 100 mg via ORAL
  Filled 2016-09-27 (×5): qty 1

## 2016-09-27 MED ORDER — CELECOXIB 200 MG PO CAPS
200.0000 mg | ORAL_CAPSULE | Freq: Two times a day (BID) | ORAL | Status: DC
  Administered 2016-09-27 – 2016-09-29 (×4): 200 mg via ORAL
  Filled 2016-09-27 (×4): qty 1

## 2016-09-27 MED ORDER — TRAMADOL HCL 50 MG PO TABS
100.0000 mg | ORAL_TABLET | Freq: Four times a day (QID) | ORAL | Status: DC | PRN
  Administered 2016-09-28 – 2016-09-29 (×2): 100 mg via ORAL
  Filled 2016-09-27 (×2): qty 2

## 2016-09-27 SURGICAL SUPPLY — 44 items
APL SKNCLS STERI-STRIP NONHPOA (GAUZE/BANDAGES/DRESSINGS) ×1
BAG SPEC THK2 15X12 ZIP CLS (MISCELLANEOUS) ×1
BAG ZIPLOCK 12X15 (MISCELLANEOUS) ×1 IMPLANT
BENZOIN TINCTURE PRP APPL 2/3 (GAUZE/BANDAGES/DRESSINGS) ×1 IMPLANT
BLADE SAW SGTL 18X1.27X75 (BLADE) ×2 IMPLANT
CAPT HIP TOTAL 2 ×1 IMPLANT
CELLS DAT CNTRL 66122 CELL SVR (MISCELLANEOUS) ×1 IMPLANT
COVER PERINEAL POST (MISCELLANEOUS) ×2 IMPLANT
COVER SURGICAL LIGHT HANDLE (MISCELLANEOUS) ×2 IMPLANT
DRAPE STERI IOBAN 125X83 (DRAPES) ×2 IMPLANT
DRAPE U-SHAPE 47X51 STRL (DRAPES) ×4 IMPLANT
DRESSING AQUACEL AG SP 3.5X10 (GAUZE/BANDAGES/DRESSINGS) IMPLANT
DRSG AQUACEL AG ADV 3.5X10 (GAUZE/BANDAGES/DRESSINGS) ×2 IMPLANT
DRSG AQUACEL AG SP 3.5X10 (GAUZE/BANDAGES/DRESSINGS) ×2
DURAPREP 26ML APPLICATOR (WOUND CARE) ×2 IMPLANT
ELECT REM PT RETURN 15FT ADLT (MISCELLANEOUS) ×2 IMPLANT
GLOVE BIO SURGEON STRL SZ7.5 (GLOVE) ×2 IMPLANT
GLOVE BIOGEL PI IND STRL 6.5 (GLOVE) IMPLANT
GLOVE BIOGEL PI IND STRL 7.5 (GLOVE) IMPLANT
GLOVE BIOGEL PI IND STRL 8 (GLOVE) ×2 IMPLANT
GLOVE BIOGEL PI INDICATOR 6.5 (GLOVE) ×1
GLOVE BIOGEL PI INDICATOR 7.5 (GLOVE) ×3
GLOVE BIOGEL PI INDICATOR 8 (GLOVE) ×2
GLOVE ECLIPSE 6.5 STRL STRAW (GLOVE) ×1 IMPLANT
GLOVE ECLIPSE 8.0 STRL XLNG CF (GLOVE) ×2 IMPLANT
GLOVE SURG SS PI 7.5 STRL IVOR (GLOVE) ×1 IMPLANT
GOWN STRL REUS W/ TWL XL LVL3 (GOWN DISPOSABLE) IMPLANT
GOWN STRL REUS W/TWL LRG LVL3 (GOWN DISPOSABLE) ×1 IMPLANT
GOWN STRL REUS W/TWL XL LVL3 (GOWN DISPOSABLE) ×6 IMPLANT
HANDPIECE INTERPULSE COAX TIP (DISPOSABLE) ×2
HOLDER FOLEY CATH W/STRAP (MISCELLANEOUS) ×2 IMPLANT
PACK ANTERIOR HIP CUSTOM (KITS) ×2 IMPLANT
RETRACTOR WND ALEXIS 18 MED (MISCELLANEOUS) ×1 IMPLANT
RTRCTR WOUND ALEXIS 18CM MED (MISCELLANEOUS) ×2
SET HNDPC FAN SPRY TIP SCT (DISPOSABLE) ×1 IMPLANT
STRIP CLOSURE SKIN 1/2X4 (GAUZE/BANDAGES/DRESSINGS) ×2 IMPLANT
SUT ETHIBOND NAB CT1 #1 30IN (SUTURE) ×2 IMPLANT
SUT MNCRL AB 4-0 PS2 18 (SUTURE) ×1 IMPLANT
SUT VIC AB 0 CT1 36 (SUTURE) ×2 IMPLANT
SUT VIC AB 1 CT1 36 (SUTURE) ×2 IMPLANT
SUT VIC AB 2-0 CT1 27 (SUTURE) ×4
SUT VIC AB 2-0 CT1 TAPERPNT 27 (SUTURE) ×2 IMPLANT
TRAY FOLEY CATH SILVER 14FR (SET/KITS/TRAYS/PACK) ×1 IMPLANT
YANKAUER SUCT BULB TIP 10FT TU (MISCELLANEOUS) ×2 IMPLANT

## 2016-09-27 NOTE — H&P (Signed)
TOTAL HIP ADMISSION H&P  Patient is admitted for right total hip arthroplasty.  Subjective:  Chief Complaint: right hip pain  HPI: Christine PatrickDonna M Kirby, 61 y.o. female, has a history of pain and functional disability in the right hip(s) due to idiopathic avascular necrosis and patient has failed non-surgical conservative treatments for greater than 12 weeks to include NSAID's and/or analgesics and activity modification.  Onset of symptoms was gradual starting 1 years ago with gradually worsening course since that time.The patient noted no past surgery on the right hip(s).  Patient currently rates pain in the right hip at 10 out of 10 with activity. Patient has night pain, worsening of pain with activity and weight bearing, pain that interfers with activities of daily living and pain with passive range of motion. Patient has evidence of subchondral cysts by imaging studies. This condition presents safety issues increasing the risk of falls.  There is no current active infection.  Patient Active Problem List   Diagnosis Date Noted  . Avascular necrosis of hip, right (HCC) 09/27/2016  . AVN of femur (HCC) 07/30/2016  . Polyarthralgia 07/30/2016  . Chronic right shoulder pain 07/30/2016  . Right hand pain 07/30/2016  . Facet arthritis of lumbar region (HCC) 05/20/2016  . Facet arthritis of cervical region (HCC) 05/20/2016  . Primary osteoarthritis of both hips 12/18/2015  . Chronic bronchitis (HCC) 09/25/2015  . Essential hypertension 09/25/2015  . GAD (generalized anxiety disorder) 09/25/2015  . Gastroesophageal reflux disease without esophagitis 09/25/2015  . Hyperlipemia 09/25/2015  . Type 2 diabetes mellitus with complication (HCC) 09/25/2015   Past Medical History:  Diagnosis Date  . Anxiety and depression   . Asthma    related to seasonal allergies  . Diabetes mellitus with complication (HCC)    microalbuminuria 03/2015  . GERD (gastroesophageal reflux disease)   . Hyperlipidemia,  mixed 07/2015  . Hypertension   . Vitamin D deficiency     Past Surgical History:  Procedure Laterality Date  . FOOT SURGERY    . INCONTINENCE SURGERY    . SHOULDER SURGERY    . TONSILLECTOMY      Prescriptions Prior to Admission  Medication Sig Dispense Refill Last Dose  . acetaminophen-codeine (TYLENOL #3) 300-30 MG tablet Take 1-2 tablets by mouth every 8 (eight) hours as needed for moderate pain.   09/26/2016 at Unknown time  . ALPRAZolam (XANAX) 1 MG tablet Take 1 mg by mouth 3 (three) times daily as needed. Anxiety   Taking  . amLODipine (NORVASC) 10 MG tablet Take 10 mg by mouth daily.   09/27/2016 at 0800  . atenolol (TENORMIN) 50 MG tablet Take 50 mg by mouth daily.   09/27/2016 at 0800  . celecoxib (CELEBREX) 100 MG capsule Take 1 capsule (100 mg total) by mouth 2 (two) times daily as needed. (Patient taking differently: Take 100 mg by mouth 2 (two) times daily as needed for moderate pain. ) 60 capsule 2 09/26/2016 at Unknown time  . cyclobenzaprine (FLEXERIL) 10 MG tablet Take 10 mg by mouth 2 (two) times daily as needed for muscle spasms.   0 09/26/2016 at Unknown time  . cycloSPORINE (RESTASIS) 0.05 % ophthalmic emulsion Place 1 drop into both eyes at bedtime.   Past Month at Unknown time  . escitalopram (LEXAPRO) 10 MG tablet Take 10 mg by mouth daily.   09/27/2016 at 0800  . fluticasone (FLONASE) 50 MCG/ACT nasal spray Place 2 sprays into both nostrils daily as needed for allergies.    Past  Month at Unknown time  . gabapentin (NEURONTIN) 100 MG capsule Take 1 capsule (100 mg total) by mouth 3 (three) times daily. 60 capsule 1 09/26/2016 at Unknown time  . gemfibrozil (LOPID) 600 MG tablet Take 1 tablet every AM and 1 tablet every PM. (Patient taking differently: Take 300 mg by mouth 2 (two) times daily. )   Taking  . Liniments (SALONPAS PAIN RELIEF PATCH EX) Apply 1 patch topically daily as needed (pain).     Marland Kitchen losartan (COZAAR) 50 MG tablet Take 1/2 tablet in the morning. 90 tablet 3  09/26/2016 at Unknown time  . metFORMIN (GLUCOPHAGE) 500 MG tablet Take 250 mg by mouth 2 (two) times daily.    Taking  . omeprazole (PRILOSEC) 20 MG capsule Take 20 mg by mouth daily.   09/27/2016 at 0800  . traMADol (ULTRAM) 50 MG tablet Take 1 tablet (50 mg total) by mouth every 6 (six) hours as needed for moderate pain. (Patient taking differently: Take 50 mg by mouth every 8 (eight) hours as needed for moderate pain. ) 30 tablet 0 Taking  . albuterol (PROVENTIL HFA;VENTOLIN HFA) 108 (90 BASE) MCG/ACT inhaler Inhale 2 puffs into the lungs every 6 (six) hours as needed for wheezing or shortness of breath.    More than a month at Unknown time  . alendronate (FOSAMAX) 70 MG tablet Take 70 mg by mouth every Saturday. Take with a full glass of water on an empty stomach.   More than a month at Unknown time  . sertraline (ZOLOFT) 100 MG tablet Take 100 mg by mouth daily.   Unknown at Unknown time   Allergies  Allergen Reactions  . Penicillins Nausea And Vomiting    Has patient had a PCN reaction causing immediate rash, facial/tongue/throat swelling, SOB or lightheadedness with hypotension: No Has patient had a PCN reaction causing severe rash involving mucus membranes or skin necrosis: No Has patient had a PCN reaction that required hospitalization: No Has patient had a PCN reaction occurring within the last 10 years: No If all of the above answers are "NO", then may proceed with Cephalosporin use.     Social History  Substance Use Topics  . Smoking status: Former Smoker    Types: Cigarettes    Quit date: 04/15/2016  . Smokeless tobacco: Never Used  . Alcohol use Yes     Comment: occasional    History reviewed. No pertinent family history.   Review of Systems  Musculoskeletal: Positive for joint pain.  All other systems reviewed and are negative.   Objective:  Physical Exam  Constitutional: She is oriented to person, place, and time. She appears well-developed and well-nourished.   HENT:  Head: Normocephalic and atraumatic.  Eyes: EOM are normal. Pupils are equal, round, and reactive to light.  Neck: Normal range of motion. Neck supple.  Cardiovascular: Normal rate and regular rhythm.   Respiratory: Effort normal and breath sounds normal.  GI: Soft. Bowel sounds are normal.  Musculoskeletal:       Right hip: She exhibits decreased range of motion, decreased strength, tenderness and bony tenderness.  Neurological: She is alert and oriented to person, place, and time.  Skin: Skin is warm and dry.  Psychiatric: She has a normal mood and affect.    Vital signs in last 24 hours: Temp:  [97.7 F (36.5 C)] 97.7 F (36.5 C) (06/08 1108) Pulse Rate:  [62] 62 (06/08 1108) Resp:  [16] 16 (06/08 1108) BP: (140)/(79) 140/79 (06/08 1108) SpO2:  [  97 %] 97 % (06/08 1108) Weight:  [190 lb (86.2 kg)] 190 lb (86.2 kg) (06/08 1133)  Labs:   Estimated body mass index is 27.26 kg/m as calculated from the following:   Height as of this encounter: 5\' 10"  (1.778 m).   Weight as of this encounter: 190 lb (86.2 kg).   Imaging Review MRI shows avascular necrosis and arthritic changes in the right hip  Assessment/Plan:  AVN, right hip(s)  The patient history, physical examination, clinical judgement of the provider and imaging studies are consistent with end stage avascular necrosis of the right hip(s) and total hip arthroplasty is deemed medically necessary. The treatment options including medical management, injection therapy, arthroscopy and arthroplasty were discussed at length. The risks and benefits of total hip arthroplasty were presented and reviewed. The risks due to aseptic loosening, infection, stiffness, dislocation/subluxation,  thromboembolic complications and other imponderables were discussed.  The patient acknowledged the explanation, agreed to proceed with the plan and consent was signed. Patient is being admitted for inpatient treatment for surgery, pain  control, PT, OT, prophylactic antibiotics, VTE prophylaxis, progressive ambulation and ADL's and discharge planning.The patient is planning to be discharged home with home health services

## 2016-09-27 NOTE — Op Note (Signed)
NAMMilus Height:  Bogus, Rick                ACCOUNT NO.:  [id number]  MEDICAL RECORD NO.:  [id number]  LOCATION:                                 FACILITY:  PHYSICIAN:  Vanita PandaChristopher Y. Magnus IvanBlackman, M.D.DATE OF BIRTH:  Sep 18, 1955  DATE OF PROCEDURE:  09/27/2016 DATE OF DISCHARGE:                              OPERATIVE REPORT   PREOPERATIVE DIAGNOSIS:  Avascular necrosis, right hip, on superimposed osteoarthritis.  POSTOPERATIVE DIAGNOSIS:  Avascular necrosis, right hip, on superimposed osteoarthritis.  PROCEDURE:  Right total hip arthroplasty through direct anterior approach.  IMPLANTS:  DePuy Sector Gription acetabular component size 50, size 32 +0 neutral polyethylene liner, size 12 Corail femoral component with standard offset, size 32 +1 ceramic hip ball.  SURGEON:  Vanita PandaChristopher Y. Magnus IvanBlackman, M.D.  ASSISTANT:  Richardean CanalGilbert Clark, PA-C.  ANESTHESIA:  Spinal.  ANTIBIOTICS:  900 mg of IV clindamycin.  BLOOD LOSS:  200 mL.  COMPLICATIONS:  None.  INDICATIONS:  [name] is a 61 year old female with debilitating bilateral hip pain that is much worse on the right than the left.  She eventually had an MRI of this hip and plain films showed just some mild arthritic changes, but the MRI was interesting for the fact that it did show cystic changes in the femoral head more consistent with avascular necrosis.  Her pain is significantly worse and she has tried conservative treatment, including offloading the hip with a walker and cane as well as intra-articular injections.  At this point, we recommended a total hip arthroplasty through direct anterior approach. We had a long and thorough discussion.  Risks and benefits of surgery were discussed in detail including the risk of acute blood loss anemia, nerve and vessel injury, fracture, infection, dislocation, DVT.  She understands our goals are to decrease pain, improved mobility, and overall improved quality of life.  PROCEDURE DESCRIPTION:   After informed consent was obtained, appropriate right hip was marked.  She was brought to the operating room where spinal anesthesia was obtained while she was on her stretcher.  She was then laid in a supine position on the stretcher.  A Foley catheter was placed.  Then, both legs had traction boots applied to them.  We did assess her leg lengths with lying supine, we felt her leg lengths were equal with slight flexion contracture of both knees.  We then placed her supine on the Hana operative table with the perineal post in place and both legs in inline skeletal traction devices, no traction applied.  We then assessed the hip fluoroscopically so we got our good preoperative leg-length and hip offset assessment as well.  We then prepped her right hip with DuraPrep and sterile drapes.  Time-out was called and she was identified as the correct patient and correct right hip.  I then made an incision just inferior and posterior to the anterior superior iliac spine and carried this obliquely down the leg.  I dissected down tensor fascia lata muscle.  The tensor fascia was then divided longitudinally to proceed with a direct anterior approach to the hip.  I identified and cauterized the circumflex vessels then identified the hip capsule.  I opened up the hip capsule  in an L-type format, finding a moderate joint effusion.  I placed Cobra retractors around the medial and lateral femoral neck and then made our femoral neck cut with an oscillating saw just proximal to lesser trochanter and completed this on osteotome.  I placed a corkscrew guide in the femoral head and removed the femoral head in its entirety and found some areas with cartilage irregularities as well as some soft cartilage consistent with avascular necrosis.  I then passed this off the back table.  I removed remnants of acetabular labrum and other debris and then began reaming under direct visualization from a size 42 reamer in  stepwise increments up to a size 50 with all reamers under direct visualization and the last reamer under direct fluoroscopy, so I could obtain my depth of reaming, inclination, and anteversion.  Once I was pleased with this, I placed the real DePuy Sector Gription acetabular component size 50, and a 32 +0 neutral polyethylene liner for that size acetabular component.  Attention was then turned to the femur.  With the leg externally rotated to 120 degrees, extended and adducted, I was able to place a Mueller retractor medially and a Hohmann retractor behind the greater trochanter.  I released the lateral joint capsule and used a box cutting osteotome to enter femoral canal and a rongeur to lateralize.  I then began broaching from a size 8 broach using the Corail broaching system going up to a size 12.  Size 12 had a good fit to this.  I trialed a standard offset femoral neck and a 32 +1 trial hip ball, reduced this in the acetabulum. We assessed for leg length and offset under direct fluoroscopy and then range of motion and stability, which I did appreciate this with having good stability.  We then dislocated the hip and removed the trial components.  I was able to place the real Corail femoral component with standard offset, size 12 and the real 32 +1 ceramic hip ball and reduced this again in the pelvis and assessed it radiographically and clinically and we were pleased with offset, leg length, and stability as well as range of motion.  We then irrigated the soft tissue with normal saline solution using pulsatile lavage.  I closed the joint capsule with interrupted #1 Ethibond suture followed by running #1 Vicryl in the tensor fascia, 0 Vicryl in the deep tissue, 2-0 Vicryl in the subcutaneous tissue, 4-0 Monocryl subcuticular stitch and Steri-Strips on the skin.  An Aquacel dressing was applied.  She was taken off the Hana table, taken to the recovery room in stable condition.  All  final counts were correct.  There were no complications noted.  Of note, Richardean Canal, PA-C, assisted in the entire case.  His assistance was crucial for facilitating all aspects of this case.     Vanita Panda. Magnus Ivan, M.D.   ______________________________ Vanita Panda. Magnus Ivan, M.D.    CYB/MEDQ  D:  09/27/2016  T:  09/27/2016  Job:  [id number]

## 2016-09-27 NOTE — Transfer of Care (Signed)
Immediate Anesthesia Transfer of Care Note  Patient: Christine PatrickDonna M Hortman  Procedure(s) Performed: Procedure(s): RIGHT TOTAL HIP ARTHROPLASTY ANTERIOR APPROACH (Right)  Patient Location: PACU  Anesthesia Type:Spinal  Level of Consciousness:  sedated, patient cooperative and responds to stimulation  Airway & Oxygen Therapy:Patient Spontanous Breathing and Patient connected to face mask oxgen  Post-op Assessment:  Report given to PACU RN and Post -op Vital signs reviewed and stable  Post vital signs:  Reviewed and stable  Last Vitals:  Vitals:   09/27/16 1108  BP: 140/79  Pulse: 62  Resp: 16  Temp: 36.5 C    Complications: No apparent anesthesia complications

## 2016-09-27 NOTE — Anesthesia Procedure Notes (Signed)
Spinal  Patient location during procedure: OR Start time: 09/27/2016 1:14 PM End time: 09/27/2016 1:18 PM Reason for block: at surgeon's request Staffing Resident/CRNA: Anne Fu Performed: resident/CRNA  Preanesthetic Checklist Completed: patient identified, site marked, surgical consent, pre-op evaluation, timeout performed, IV checked, risks and benefits discussed and monitors and equipment checked Spinal Block Patient position: sitting Prep: DuraPrep Patient monitoring: heart rate, continuous pulse ox and blood pressure Approach: right paramedian Location: L2-3 Injection technique: single-shot Needle Needle type: Pencan  Needle gauge: 24 G Needle length: 9 cm Assessment Sensory level: T6 Additional Notes Expiration date of kit checked and confirmed. Patient tolerated procedure well, without complications. X 1 attempt with noted clear CSF return. Loss of motor and sensory on exam post injection.

## 2016-09-27 NOTE — Brief Op Note (Signed)
09/27/2016  2:20 PM  PATIENT:  [name]  61 y.o. female  PRE-OPERATIVE DIAGNOSIS:  right hip avascular necrosis  POST-OPERATIVE DIAGNOSIS:  right hip avascular necrosis  PROCEDURE:  Procedure(s): RIGHT TOTAL HIP ARTHROPLASTY ANTERIOR APPROACH (Right)  SURGEON:  Surgeon(s) and Role:    Kathryne Hitch* Jaiyla Granados Y, MD - Primary  PHYSICIAN ASSISTANT: Rexene EdisonGil Clark, PA-C  ANESTHESIA:   spinal  EBL:  Total I/O In: -  Out: 400 [Urine:200; Blood:200]  COUNTS:  YES  DICTATION: .Other Dictation: Dictation Number [id number]  PLAN OF CARE: Admit to inpatient   PATIENT DISPOSITION:  PACU - hemodynamically stable.   Delay start of Pharmacological VTE agent (>24hrs) due to surgical blood loss or risk of bleeding: no

## 2016-09-27 NOTE — Anesthesia Postprocedure Evaluation (Signed)
Anesthesia Post Note  Patient: [name]  Procedure(s) Performed: Procedure(s) (LRB): RIGHT TOTAL HIP ARTHROPLASTY ANTERIOR APPROACH (Right)     Patient location during evaluation: PACU Anesthesia Type: Spinal Level of consciousness: awake Pain management: pain level controlled Vital Signs Assessment: post-procedure vital signs reviewed and stable Respiratory status: spontaneous breathing Postop Assessment: no headache, no backache, spinal receding, patient able to bend at knees and no signs of nausea or vomiting Anesthetic complications: no    Last Vitals:  Vitals:   09/27/16 1510 09/27/16 1515  BP: 107/64 (!) 108/55  Pulse: 62 (!) 54  Resp: 14 20  Temp:      Last Pain:  Vitals:   09/27/16 1515  TempSrc:   PainSc: 5    Pain Goal: Patients Stated Pain Goal: 4 (09/27/16 1133)               Dillinger Aston JR,JOHN Susann GivensFRANKLIN

## 2016-09-27 NOTE — Op Note (Deleted)
  The note originally documented on this encounter has been moved the the encounter in which it belongs.  

## 2016-09-27 NOTE — Anesthesia Preprocedure Evaluation (Signed)
Anesthesia Evaluation  Patient identified by MRN, date of birth, ID band Patient awake    Reviewed: Allergy & Precautions, NPO status , Patient's Chart, lab work & pertinent test results, reviewed documented beta blocker date and time   Airway Mallampati: I       Dental no notable dental hx. (+) Teeth Intact   Pulmonary former smoker,    Pulmonary exam normal breath sounds clear to auscultation       Cardiovascular hypertension, Pt. on medications and Pt. on home beta blockers Normal cardiovascular exam Rhythm:Regular Rate:Normal     Neuro/Psych PSYCHIATRIC DISORDERS Anxiety Depression negative psych ROS   GI/Hepatic GERD  Medicated and Controlled,  Endo/Other  diabetes, Type 2, Oral Hypoglycemic Agents  Renal/GU      Musculoskeletal   Abdominal Normal abdominal exam  (+)   Peds  Hematology  (+) Blood dyscrasia, anemia ,   Anesthesia Other Findings   Reproductive/Obstetrics                             Anesthesia Physical Anesthesia Plan  ASA: II  Anesthesia Plan: Spinal   Post-op Pain Management:    Induction:   PONV Risk Score and Plan: 3 and Ondansetron, Dexamethasone, Propofol, Midazolam and Treatment may vary due to age  Airway Management Planned: Simple Face Mask, Natural Airway and Nasal Cannula  Additional Equipment:   Intra-op Plan:   Post-operative Plan:   Informed Consent: I have reviewed the patients History and Physical, chart, labs and discussed the procedure including the risks, benefits and alternatives for the proposed anesthesia with the patient or authorized representative who has indicated his/her understanding and acceptance.     Plan Discussed with:   Anesthesia Plan Comments:         Anesthesia Quick Evaluation

## 2016-09-27 NOTE — Progress Notes (Signed)
Pt complaining of pain, says it feels like pressure in her hip.  On regular diet with no complaints of nausea.  Pain and getting comfortable is her biggest issue.  We have tried repositioning and getting up to the chair as well as pain meds.  Will continue to monitor and try to get her comfortable.

## 2016-09-28 LAB — CBC
HCT: 30.1 % — ABNORMAL LOW (ref 36.0–46.0)
Hemoglobin: 10 g/dL — ABNORMAL LOW (ref 12.0–15.0)
MCH: 28.7 pg (ref 26.0–34.0)
MCHC: 33.2 g/dL (ref 30.0–36.0)
MCV: 86.5 fL (ref 78.0–100.0)
Platelets: 195 10*3/uL (ref 150–400)
RBC: 3.48 MIL/uL — ABNORMAL LOW (ref 3.87–5.11)
RDW: 13.1 % (ref 11.5–15.5)
WBC: 6.7 10*3/uL (ref 4.0–10.5)

## 2016-09-28 LAB — GLUCOSE, CAPILLARY
GLUCOSE-CAPILLARY: 95 mg/dL (ref 65–99)
Glucose-Capillary: 91 mg/dL (ref 65–99)
Glucose-Capillary: 106 mg/dL — ABNORMAL HIGH (ref 65–99)
Glucose-Capillary: 137 mg/dL — ABNORMAL HIGH (ref 65–99)

## 2016-09-28 LAB — BASIC METABOLIC PANEL
Anion gap: 8 (ref 5–15)
BUN: 10 mg/dL (ref 6–20)
CO2: 29 mmol/L (ref 22–32)
Calcium: 8.7 mg/dL — ABNORMAL LOW (ref 8.9–10.3)
Chloride: 103 mmol/L (ref 101–111)
Creatinine, Ser: 0.62 mg/dL (ref 0.44–1.00)
GFR calc Af Amer: >60 mL/min (ref >60)
GFR calc non Af Amer: >60 mL/min (ref >60)
Glucose, Bld: 128 mg/dL — ABNORMAL HIGH (ref 65–99)
Potassium: 4.4 mmol/L (ref 3.5–5.1)
Sodium: 140 mmol/L (ref 135–145)

## 2016-09-28 MED ORDER — OXYCODONE-ACETAMINOPHEN 5-325 MG PO TABS: 1.0000 | ORAL_TABLET | ORAL | 0 refills | Status: DC | PRN

## 2016-09-28 MED ORDER — CYCLOBENZAPRINE HCL 10 MG PO TABS: 10.0000 mg | ORAL_TABLET | Freq: Three times a day (TID) | ORAL | 0 refills | Status: DC | PRN

## 2016-09-28 MED ORDER — ASPIRIN 81 MG PO CHEW: 81.0000 mg | CHEWABLE_TABLET | Freq: Two times a day (BID) | ORAL | 0 refills | Status: DC

## 2016-09-28 NOTE — Progress Notes (Signed)
Physical Therapy Treatment Patient Details Name: [name] MRN: [id number] DOB: [birth year] Today's Date: 09/28/2016    History of Present Illness 61 yo female s/p direct anterior hip R LE.  Pt with hx of DM    PT Comments    Pt cooperative and progressing steadily with mobility.  Pt hopeful for dc home tomorrow.   Follow Up Recommendations  Home health PT     Equipment Recommendations  Rolling walker with 5" wheels    Recommendations for Other Services OT consult     Precautions / Restrictions Precautions Precautions: Fall Restrictions Weight Bearing Restrictions: No    Mobility  Bed Mobility Overal bed mobility: Needs Assistance Bed Mobility: Sit to Supine       Sit to supine: Min guard   General bed mobility comments: cues for sequence and use of L LE to assist  Transfers Overall transfer level: Needs assistance Equipment used: Rolling walker (2 wheeled) Transfers: Sit to/from Stand Sit to Stand: Min guard         General transfer comment: cues for LE management and use of UEs to self assist  Ambulation/Gait Ambulation/Gait assistance: Min assist;Min guard Ambulation Distance (Feet): 170 Feet (and 15' back from bathroom) Assistive device: Rolling walker (2 wheeled) Gait Pattern/deviations: Step-to pattern;Decreased step length - right;Decreased step length - left;Shuffle;Trunk flexed Gait velocity: decr Gait velocity interpretation: Below normal speed for age/gender General Gait Details: cues for posture, position from RW and sequence.  Distance ltd by onset of dizziness   Stairs            Wheelchair Mobility    Modified Rankin (Stroke Patients Only)       Balance                                            Cognition Arousal/Alertness: Awake/alert Behavior During Therapy: WFL for tasks assessed/performed Overall Cognitive Status: Within Functional Limits for tasks assessed                                         Exercises Total Joint Exercises Ankle Circles/Pumps: AROM;Both;15 reps;Supine    General Comments        Pertinent Vitals/Pain Pain Assessment: Faces Pain Score: 4  Pain Location: R hip/thigh Pain Descriptors / Indicators: Aching;Burning Pain Intervention(s): Limited activity within patient's tolerance;Monitored during session;Premedicated before session;Ice applied    Home Living                      Prior Function            PT Goals (current goals can now be found in the care plan section) Acute Rehab PT Goals Patient Stated Goal: Regain IND PT Goal Formulation: With patient Time For Goal Achievement: 10/02/16 Potential to Achieve Goals: Good Progress towards PT goals: Progressing toward goals    Frequency    7X/week      PT Plan Current plan remains appropriate    Co-evaluation              AM-PAC PT "6 Clicks" Daily Activity  Outcome Measure  Difficulty turning over in bed (including adjusting bedclothes, sheets and blankets)?: A Little Difficulty moving from lying on back to sitting on the side of the bed? : A Little  Difficulty sitting down on and standing up from a chair with arms (e.g., wheelchair, bedside commode, etc,.)?: A Little Help needed moving to and from a bed to chair (including a wheelchair)?: A Little Help needed walking in hospital room?: A Little Help needed climbing 3-5 steps with a railing? : A Little 6 Click Score: 18    End of Session Equipment Utilized During Treatment: Gait belt Activity Tolerance: Patient tolerated treatment well Patient left: in bed;with call bell/phone within reach Nurse Communication: Mobility status PT Visit Diagnosis: Unsteadiness on feet (R26.81);Difficulty in walking, not elsewhere classified (R26.2)     Time: [id number] PT Time Calculation (min) (ACUTE ONLY): 24 min  Charges:  $Gait Training: 8-22 mins $Therapeutic Activity: 8-22 mins                    G Codes:        Pg [phone]     Daleena Rotter 09/28/2016, 3:24 PM

## 2016-09-28 NOTE — Progress Notes (Signed)
Subjective: 1 Day Post-Op Procedure(s) (LRB): RIGHT TOTAL HIP ARTHROPLASTY ANTERIOR APPROACH (Right) Patient reports pain as moderate.  Asymptomatic acute blood loss anemia.  Already making progress with therapy.  Objective: Vital signs in last 24 hours: Temp:  [97.4 F (36.3 C)-98.1 F (36.7 C)] 97.8 F (36.6 C) (06/09 0436) Pulse Rate:  [51-81] 70 (06/09 0436) Resp:  [12-20] 17 (06/09 0436) BP: (84-140)/(55-79) 113/63 (06/09 0436) SpO2:  [97 %-100 %] 100 % (06/09 0436) Weight:  [190 lb (86.2 kg)] 190 lb (86.2 kg) (06/08 1133)  Intake/Output from previous day: 06/08 0701 - 06/09 0700 In: 3515 [P.O.:700; I.V.:2500; IV Piggyback:315] Out: 2800 [Urine:2600; Blood:200] Intake/Output this shift: Total I/O In: 240 [P.O.:240] Out: -    Recent Labs  09/28/16 0419  HGB 10.0*    Recent Labs  09/28/16 0419  WBC 6.7  RBC 3.48*  HCT 30.1*  PLT 195    Recent Labs  09/28/16 0419  NA 140  K 4.4  CL 103  CO2 29  BUN 10  CREATININE 0.62  GLUCOSE 128*  CALCIUM 8.7*   No results for input(s): LABPT, INR in the last 72 hours.  Sensation intact distally Intact pulses distally Dorsiflexion/Plantar flexion intact Incision: scant drainage  Assessment/Plan: 1 Day Post-Op Procedure(s) (LRB): RIGHT TOTAL HIP ARTHROPLASTY ANTERIOR APPROACH (Right) Up with therapy Plan for discharge tomorrow Discharge home with home health  Kathryne HitchChristopher Y Dinh Ayotte 09/28/2016, 9:32 AM

## 2016-09-28 NOTE — Evaluation (Signed)
Physical Therapy Evaluation Patient Details Name: [name] MRN: [id number] DOB: 1955-08-13 Today's Date: 09/28/2016   History of Present Illness  61 yo female s/p direct anterior hip R LE.  Pt with hx of DM  Clinical Impression  Pt s/p R THR and presents with decreased R LE strength/ROM and post op pain limiting functional mobility.  Pt should progress to dc home with family assist.    Follow Up Recommendations Home health PT    Equipment Recommendations  Rolling walker with 5" wheels    Recommendations for Other Services OT consult     Precautions / Restrictions Precautions Precautions: Fall Restrictions Weight Bearing Restrictions: No      Mobility  Bed Mobility Overal bed mobility: Needs Assistance Bed Mobility: Supine to Sit     Supine to sit: Min assist     General bed mobility comments: increased time with cues for sequence and use of L LE to self assist  Transfers Overall transfer level: Needs assistance Equipment used: Rolling walker (2 wheeled) Transfers: Sit to/from Stand Sit to Stand: Min assist         General transfer comment: cues for LE management and use of UEs to self assist  Ambulation/Gait Ambulation/Gait assistance: Min assist Ambulation Distance (Feet): 60 Feet Assistive device: Rolling walker (2 wheeled) Gait Pattern/deviations: Step-to pattern;Decreased step length - right;Decreased step length - left;Shuffle;Trunk flexed Gait velocity: decr Gait velocity interpretation: Below normal speed for age/gender General Gait Details: cues for posture, position from RW and sequence.  Distance ltd by onset of dizziness  Stairs            Wheelchair Mobility    Modified Rankin (Stroke Patients Only)       Balance Overall balance assessment: Needs assistance Sitting-balance support: No upper extremity supported;Feet supported Sitting balance-Leahy Scale: Good     Standing balance support: No upper extremity  supported Standing balance-Leahy Scale: Fair                               Pertinent Vitals/Pain Pain Assessment: 0-10 Pain Score: 4  Pain Location: R hip/thigh Pain Descriptors / Indicators: Aching;Burning Pain Intervention(s): Limited activity within patient's tolerance;Monitored during session;Premedicated before session;Ice applied    Home Living Family/patient expects to be discharged to:: Private residence Living Arrangements: Spouse/significant other Available Help at Discharge: Family;Available 24 hours/day Type of Home: House Home Access: Stairs to enter   Entergy CorporationEntrance Stairs-Number of Steps: 1 Home Layout: One level Home Equipment: None      Prior Function Level of Independence: Independent         Comments: sometimes with shoulder pain - husband helps with bra, pedicure trim (A)     Hand Dominance   Dominant Hand: Right    Extremity/Trunk Assessment   Upper Extremity Assessment Upper Extremity Assessment: Defer to OT evaluation RUE Deficits / Details: rotator cuff tear per patient. pt reports overhead task easier than pushing down LUE Deficits / Details: baseline pain     Lower Extremity Assessment Lower Extremity Assessment: RLE deficits/detail RLE Deficits / Details: Strength at hip 2+/5 with AAROM at hip to 85 flexion and 15 abd    Cervical / Trunk Assessment Cervical / Trunk Assessment: Normal  Communication   Communication: No difficulties  Cognition Arousal/Alertness: Awake/alert Behavior During Therapy: WFL for tasks assessed/performed Overall Cognitive Status: Within Functional Limits for tasks assessed  General Comments      Exercises Total Joint Exercises Ankle Circles/Pumps: AROM;Both;15 reps;Supine Quad Sets: AROM;Both;10 reps;Supine Heel Slides: AAROM;Right;20 reps;Supine Hip ABduction/ADduction: AAROM;Right;15 reps;Supine   Assessment/Plan    PT Assessment  Patient needs continued PT services  PT Problem List Decreased strength;Decreased range of motion;Decreased activity tolerance;Decreased balance;Decreased mobility;Pain       PT Treatment Interventions DME instruction;Gait training;Stair training;Functional mobility training;Therapeutic exercise;Therapeutic activities;Patient/family education    PT Goals (Current goals can be found in the Care Plan section)  Acute Rehab PT Goals Patient Stated Goal: Regain IND PT Goal Formulation: With patient Time For Goal Achievement: 10/02/16 Potential to Achieve Goals: Good    Frequency 7X/week   Barriers to discharge        Co-evaluation               AM-PAC PT "6 Clicks" Daily Activity  Outcome Measure Difficulty turning over in bed (including adjusting bedclothes, sheets and blankets)?: A Little Difficulty moving from lying on back to sitting on the side of the bed? : A Little Difficulty sitting down on and standing up from a chair with arms (e.g., wheelchair, bedside commode, etc,.)?: A Little Help needed moving to and from a bed to chair (including a wheelchair)?: A Little Help needed walking in hospital room?: A Little Help needed climbing 3-5 steps with a railing? : A Little 6 Click Score: 18    End of Session Equipment Utilized During Treatment: Gait belt Activity Tolerance: Patient tolerated treatment well Patient left: in chair;with call bell/phone within reach Nurse Communication: Mobility status PT Visit Diagnosis: Unsteadiness on feet (R26.81);Difficulty in walking, not elsewhere classified (R26.2)    Time: 1610-9604 PT Time Calculation (min) (ACUTE ONLY): 38 min   Charges:   PT Evaluation $PT Eval Low Complexity: 1 Procedure PT Treatments $Gait Training: 8-22 mins $Therapeutic Exercise: 8-22 mins   PT G Codes:        Pg [phone]   Omero Kowal 09/28/2016, 11:21 AM

## 2016-09-28 NOTE — Progress Notes (Signed)
Occupational Therapy Evaluation Patient Details Name: Christine PatrickDonna M Nola MRN: [id number] DOB: [birth year] Today's Date: 09/28/2016    History of Present Illness 61 yo female s/p direct anterior hip R LE   Past Medical History:  Diagnosis Date  . Anxiety and depression   . Asthma    related to seasonal allergies  . Diabetes mellitus with complication (HCC)    microalbuminuria 03/2015  . GERD (gastroesophageal reflux disease)   . Hyperlipidemia, mixed 07/2015  . Hypertension   . Vitamin D deficiency       Clinical Impression   Patient is s/p R direct THA surgery resulting in functional limitations due to the deficits listed below (see OT problem list). PTA was independent with all adls. Pt mod (A) for LB prior to any education.  Patient will benefit from skilled OT acutely to increase independence and safety with ADLS to allow discharge home without follow up.     Follow Up Recommendations  No OT follow up    Equipment Recommendations  3 in 1 bedside commode;Other (comment) (may be able to borrow from [facility]/ order RW)    Recommendations for Other Services       Precautions / Restrictions Precautions Precautions: Fall Restrictions Weight Bearing Restrictions: No      Mobility Bed Mobility               General bed mobility comments: in bed with breakfast arrival. Education focused on hygiene and precautions, setup of environment with bed level with return for demo.   Transfers              Balance Overall balance assessment: Needs assistance Sitting-balance support: No upper extremity supported;Feet supported Sitting balance-Leahy Scale: Good                                ADL either performed or assessed with clinical judgement   ADL Overall ADL's : Needs assistance/impaired Eating/Feeding: Independent   Grooming: Wash/dry hands;Wash/dry face;Oral care;Standing                      .      Vision Baseline Vision/History:  Wears glasses Wears Glasses: At all times       Perception     Praxis      Pertinent Vitals/Pain Pain Assessment: No/denies pain     Hand Dominance Right   Extremity/Trunk Assessment Upper Extremity Assessment Upper Extremity Assessment: RUE deficits/detail;LUE deficits/detail RUE Deficits / Details: rotator cuff tear per patient. pt reports overhead task easier than pushing down LUE Deficits / Details: baseline pain    Lower Extremity Assessment Lower Extremity Assessment: Defer to PT evaluation   Cervical / Trunk Assessment Cervical / Trunk Assessment: Normal   Communication Communication Communication: No difficulties   Cognition Arousal/Alertness: Awake/alert Behavior During Therapy: WFL for tasks assessed/performed Overall Cognitive Status: Within Functional Limits for tasks assessed                                     General Comments  incision intact     Exercises     Shoulder Instructions      Home Living Family/patient expects to be discharged to:: Private residence Living Arrangements: Spouse/significant other Available Help at Discharge: Family;Available 24 hours/day (spouse retired) Type of Home: House Home Access: Stairs to enter Entergy CorporationEntrance Stairs-Number of Steps:  1 (long step onto the porch)   Home Layout: One level     Bathroom Shower/Tub: Chief Strategy Officer: Handicapped height     Home Equipment: None   Additional Comments: drives a honda      Prior Functioning/Environment Level of Independence: Independent        Comments: sometimes with shoulder pain - husband helps with bra, pedicure trim (A)        OT Problem List: Decreased activity tolerance;Impaired balance (sitting and/or standing);Decreased knowledge of use of DME or AE;Decreased knowledge of precautions;Pain      OT Treatment/Interventions: Self-care/ADL training;Therapeutic exercise;DME and/or AE instruction;Therapeutic  activities;Patient/family education;Balance training    OT Goals(Current goals can be found in the care plan section) Acute Rehab OT Goals Patient Stated Goal: to return home OT Goal Formulation: With patient Time For Goal Achievement: 10/12/16 Potential to Achieve Goals: Good  OT Frequency: Min 2X/week   Barriers to D/C:            Co-evaluation              AM-PAC PT "6 Clicks" Daily Activity     Outcome Measure Help from another person eating meals?: None Help from another person taking care of personal grooming?: None Help from another person toileting, which includes using toliet, bedpan, or urinal?: A Little Help from another person bathing (including washing, rinsing, drying)?: A Little Help from another person to put on and taking off regular upper body clothing?: A Little Help from another person to put on and taking off regular lower body clothing?: A Little 6 Click Score: 20   End of Session Equipment Utilized During Treatment: Gait belt;Rolling walker Nurse Communication: Mobility status;Precautions  Activity Tolerance: Patient tolerated treatment well Patient left: in chair;with call bell/phone within reach  OT Visit Diagnosis: Unsteadiness on feet (R26.81)                Time: 1610-9604 (900-945) OT Time Calculation (min): 20 min Charges:  OT General Charges $OT Visit: 1 Procedure OT Evaluation $OT Eval Moderate Complexity: 1 Procedure OT Treatments $Self Care/Home Management : 38-52 mins G-Codes:      Mateo Flow   OTR/L Pager: [phone] Office: [phone] .   Boone Master B 09/28/2016, 9:59 AM

## 2016-09-28 NOTE — Progress Notes (Addendum)
Occupational Therapy Treatment Patient Details Name: [name] MRN: [id number] DOB: February 01, 1956 Today's Date: 09/28/2016    History of present illness 61 yo female s/p direct anterior hip R LE   Past Medical History:  Diagnosis Date  . Anxiety and depression   . Asthma    related to seasonal allergies  . Diabetes mellitus with complication (HCC)    microalbuminuria 03/2015  . GERD (gastroesophageal reflux disease)   . Hyperlipidemia, mixed 07/2015  . Hypertension   . Vitamin D deficiency     OT comments  Pt completed tub transfer with DME and basic transfer. Pt is adequate level to d/c home with spouse. Pt to request DME from son.    Follow Up Recommendations  No OT follow up    Equipment Recommendations  3 in 1 bedside commode;Other (comment) (may be able to borrow from [facility]/ order RW)    Recommendations for Other Services      Precautions / Restrictions Precautions Precautions: Fall Restrictions Weight Bearing Restrictions: No       Mobility Bed Mobility               General bed mobility comments: up in chair   Transfers Overall transfer level: Needs assistance Equipment used: Rolling walker (2 wheeled) Transfers: Sit to/from Stand Sit to Stand: Min guard         General transfer comment: pt educated on hand placement for transfer    Balance Overall balance assessment: Needs assistance Sitting-balance support: No upper extremity supported;Feet supported Sitting balance-Leahy Scale: Good     Standing balance support: During functional activity;Single extremity supported Standing balance-Leahy Scale: Fair                             ADL either performed or assessed with clinical judgement   ADL Overall ADL's : Needs assistance/impaired Eating/Feeding: Independent   Grooming: Wash/dry hands;Wash/dry face;Oral care;Standing                   Toilet Transfer: Hotel managerMin guard;RW;Ambulation Toilet Transfer Details (indicate  cue type and reason): recommend use of BSC height     Tub/ Shower Transfer: Tub transfer;Minimal assistance;Rolling walker;3 in 1 Tub/Shower Transfer Details (indicate cue type and reason): pt educated on transfer with return demo. Pt plans to ask son for DME to borrow Functional mobility during ADLs: Min guard;Rolling walker General ADL Comments: Pt very excited to be up and moving today. pt educated on bed, bathing, toilet , basic transfer and tub transfer. pt will have spouse (A) upon d/c .  Pt educated on bathing and avoid washing directly on incision. Pt educated to use new wash cloth and towel each day. Pt educated to allow water to run across dressing and not to soak in a tub at this time. Pt advised RN will instruct on any bandages required otherwise is open to air.   Pt educated on use of reacher to LB dressing and on underwear,.     Vision Baseline Vision/History: Wears glasses Wears Glasses: At all times     Perception     Praxis      Cognition Arousal/Alertness: Awake/alert Behavior [facility] for tasks assessed/performed Overall Cognitive Status: Within Functional Limits for tasks assessed  Exercises     Shoulder Instructions       General Comments incision intact     Pertinent Vitals/ Pain       Pain Assessment: No/denies pain  Home Living Family/patient expects to be discharged to:: Private residence Living Arrangements: Spouse/significant other Available Help at Discharge: Family;Available 24 hours/day (spouse retired) Type of Home: House Home Access: Stairs to enter [employer] of Steps: 1 (long step onto the porch)   Home Layout: One level     Bathroom Shower/Tub: Chief Strategy Officer: Handicapped height     Home Equipment: None   Additional Comments: drives a honda      Prior Functioning/Environment Level of Independence: Independent         Comments: sometimes with shoulder pain - husband helps with bra, pedicure trim (A)   Frequency  Min 2X/week        Progress Toward Goals  OT Goals(current goals can now be found in the care plan section)     Acute Rehab OT Goals Patient Stated Goal: to return home OT Goal Formulation: With patient Time For Goal Achievement: 10/12/16 Potential to Achieve Goals: Good  Plan      Co-evaluation                 AM-PAC PT "6 Clicks" Daily Activity     Outcome Measure   Help from another person eating meals?: None Help from another person taking care of personal grooming?: None Help from another person toileting, which includes using toliet, bedpan, or urinal?: A Little Help from another person bathing (including washing, rinsing, drying)?: A Little Help from another person to put on and taking off regular upper body clothing?: A Little Help from another person to put on and taking off regular lower body clothing?: A Little 6 Click Score: 20    End of Session Equipment Utilized During Treatment: Gait belt;Rolling walker  OT Visit Diagnosis: Unsteadiness on feet (R26.81)   Activity Tolerance Patient tolerated treatment well   Patient Left in chair;with call bell/phone within reach   Nurse Communication Mobility status;Precautions        Time: 1610-9604 OT Time Calculation (min): 45 min  Charges: OT General Charges $OT Visit: 1 Procedure OT Evaluation $OT Eval Moderate Complexity: 1 Procedure OT Treatments $Self Care/Home Management : 38-52 mins   [name]   OTR/L Pager: [phone] Office: [phone] .    [name] 09/28/2016, 10:11 AM

## 2016-09-29 LAB — GLUCOSE, CAPILLARY: Glucose-Capillary: 110 mg/dL — ABNORMAL HIGH (ref 65–99)

## 2016-09-29 NOTE — Care Management Note (Addendum)
Case Management Note  Patient Details  Name: [name] MRN: [id number] Date of Birth: [birth year]  Subjective/Objective:    R THA              Action/Plan: Discharge Planning: NCM spoke to pt and husband at bedside.  Offered choice for HH/list provided. Contacted [facility] for DME for home. Pt agreeable to [facility] for HHPT. Husband at home to assist with care.  PCP WHITE, MARSHA L     Expected Discharge Date:  09/29/16               Expected Discharge Plan:  Home w Home Health Services  In-House Referral:  NA  Discharge planning Services  CM Consult  Post Acute Care Choice:  Home Health Choice offered to:  Patient  DME Arranged:  3-N-1, Walker rolling DME Agency:  [facility]  HH Arranged:  PT HH Agency[facility]  [facility]  Status of Service:  Completed, signed off  If discussed at [employer] of [employer] Meetings, dates discussed:    Additional Comments:  [name], [name] Ellen, RN 09/29/2016, 10:25 AM

## 2016-09-29 NOTE — Progress Notes (Signed)
Physical Therapy Treatment Patient Details Name: Christine PatrickDonna M Kirby MRN: [id number] DOB: 12/18/1955 Today's Date: 09/29/2016    History of Present Illness 61 yo female s/p direct anterior hip R LE.  Pt with hx of DM    PT Comments    Pt pleasant and agreeable to participate in PT session.  Eager to d/c home today.   Completes bed mobility and transfers with supervision, and ambulates with min guard.  Patient safe to D/C from a mobility standpoint based on progression towards goals set on PT eval.     Follow Up Recommendations  Home health PT     Equipment Recommendations  Rolling walker with 5" wheels    Recommendations for Other Services       Precautions / Restrictions Precautions Precautions: Fall Restrictions Weight Bearing Restrictions: No    Mobility  Bed Mobility Overal bed mobility: Needs Assistance Bed Mobility: Sit to Supine       Sit to supine: Supervision   General bed mobility comments: instructed to use LLE to assist with RLE into bed  Transfers Overall transfer level: Needs assistance Equipment used: Rolling walker (2 wheeled) Transfers: Sit to/from Stand Sit to Stand: Supervision         General transfer comment: for safety  Ambulation/Gait Ambulation/Gait assistance: Min guard Ambulation Distance (Feet): 140 Feet Assistive device: Rolling walker (2 wheeled) Gait Pattern/deviations: Step-through pattern;Narrow base of support Gait velocity: decr   General Gait Details: slow and guarded gt.  relies heavily on RW with UE's.  cues to increase BOS, relax shoulders, and look ahead   Stairs            Wheelchair Mobility    Modified Rankin (Stroke Patients Only)       Balance                                            Cognition Arousal/Alertness: Awake/alert Behavior During Therapy: WFL for tasks assessed/performed Overall Cognitive Status: Within Functional Limits for tasks assessed                                         Exercises Total Joint Exercises Ankle Circles/Pumps: AROM;Both;10 reps Heel Slides: AAROM;Strengthening;Right;10 reps Hip ABduction/ADduction: AAROM;Strengthening;Right;10 reps Long Arc Quad: AROM;Strengthening;Right;10 reps    General Comments General comments (skin integrity, edema, etc.): dressing dry and intact. Pt educated no heat at this time and ice only      Pertinent Vitals/Pain Pain Assessment: 0-10 Pain Score: 7  Faces Pain Scale: Hurts little more Pain Location: R hip/thigh Pain Descriptors / Indicators: Discomfort;Sore Pain Intervention(s): Premedicated before session;Monitored during session;Repositioned    Home Living                      Prior Function            PT Goals (current goals can now be found in the care plan section) Acute Rehab PT Goals Patient Stated Goal: return home today by noon PT Goal Formulation: With patient Time For Goal Achievement: 10/02/16 Potential to Achieve Goals: Good Progress towards PT goals: Progressing toward goals    Frequency    7X/week      PT Plan Current plan remains appropriate    Co-evaluation  AM-PAC PT "6 Clicks" Daily Activity  Outcome Measure                   End of Session Equipment Utilized During Treatment: Gait belt Activity Tolerance: Patient tolerated treatment well Patient left: in bed;with call bell/phone within reach Nurse Communication: Mobility status       Time: 1610-9604 PT Time Calculation (min) (ACUTE ONLY): 23 min  Charges:  $Gait Training: 8-22 mins $Therapeutic Exercise: 8-22 mins                    G Codes:       Verdell Face, PTA 09/29/2016     Lara Mulch 09/29/2016, 9:09 AM

## 2016-09-29 NOTE — Progress Notes (Signed)
     Subjective: 2 Days Post-Op Procedure(s) (LRB): RIGHT TOTAL HIP ARTHROPLASTY ANTERIOR APPROACH (Right)[place], alert and oriented x 4. "I'll need pain management for at least a week"  Patient reports pain as moderate.    Objective:   VITALS:  Temp:  [98.1 F (36.7 C)-98.7 F (37.1 C)] 98.3 F (36.8 C) (06/10 96290608) Pulse Rate:  [71-86] 85 (06/10 0608) Resp:  [15-16] 16 (06/10 0608) BP: (104-133)/(55-65) 106/58 (06/10 0608) SpO2:  [92 %-99 %] 96 % (06/10 52840608)  Neurologically intact ABD soft Neurovascular intact Sensation intact distally Intact pulses distally Dorsiflexion/Plantar flexion intact Incision: dressing C/D/I   LABS  Recent Labs  09/28/16 0419  HGB 10.0*  WBC 6.7  PLT 195    Recent Labs  09/28/16 0419  NA 140  K 4.4  CL 103  CO2 29  BUN 10  CREATININE 0.62  GLUCOSE 128*   No results for input(s): LABPT, INR in the last 72 hours.   Assessment/Plan: 2 Days Post-Op Procedure(s) (LRB): RIGHT TOTAL HIP ARTHROPLASTY ANTERIOR APPROACH (Right)  Advance diet Up with therapy Discharge home with home health  Kerrin ChampagneJames E Hena Ewalt 09/29/2016, 9:37 AM Patient ID: [name], female   DOB: Apr 28, 1955, 61 y.o.   MRN: [id number]

## 2016-09-29 NOTE — Discharge Instructions (Addendum)
INSTRUCTIONS AFTER JOINT REPLACEMENT  ° °o Remove items at home which could result in a fall. This includes throw rugs or furniture in walking pathways °o ICE to the affected joint every three hours while awake for 30 minutes at a time, for at least the first 3-5 days, and then as needed for pain and swelling.  Continue to use ice for pain and swelling. You may notice swelling that will progress down to the foot and ankle.  This is normal after surgery.  Elevate your leg when you are not up walking on it.   °o Continue to use the breathing machine you got in the hospital (incentive spirometer) which will help keep your temperature down.  It is common for your temperature to cycle up and down following surgery, especially at night when you are not up moving around and exerting yourself.  The breathing machine keeps your lungs expanded and your temperature down. ° ° °DIET:  As you were doing prior to hospitalization, we recommend a well-balanced diet. ° °DRESSING / WOUND CARE / SHOWERING ° °Keep the surgical dressing until follow up.  The dressing is water proof, so you can shower without any extra covering.  IF THE DRESSING FALLS OFF or the wound gets wet inside, change the dressing with sterile gauze.  Please use good hand washing techniques before changing the dressing.  Do not use any lotions or creams on the incision until instructed by your surgeon.   ° °ACTIVITY ° °o Increase activity slowly as tolerated, but follow the weight bearing instructions below.   °o No driving for 6 weeks or until further direction given by your physician.  You cannot drive while taking narcotics.  °o No lifting or carrying greater than 10 lbs. until further directed by your surgeon. °o Avoid periods of inactivity such as sitting longer than an hour when not asleep. This helps prevent blood clots.  °o You may return to work once you are authorized by your doctor.  ° ° ° °WEIGHT BEARING  ° °Weight bearing as tolerated with assist  device (walker, cane, etc) as directed, use it as long as suggested by your surgeon or therapist, typically at least 4-6 weeks. ° ° °EXERCISES ° °Results after joint replacement surgery are often greatly improved when you follow the exercise, range of motion and muscle strengthening exercises prescribed by your doctor. Safety measures are also important to protect the joint from further injury. Any time any of these exercises cause you to have increased pain or swelling, decrease what you are doing until you are comfortable again and then slowly increase them. If you have problems or questions, call your caregiver or physical therapist for advice.  ° °Rehabilitation is important following a joint replacement. After just a few days of immobilization, the muscles of the leg can become weakened and shrink (atrophy).  These exercises are designed to build up the tone and strength of the thigh and leg muscles and to improve motion. Often times heat used for twenty to thirty minutes before working out will loosen up your tissues and help with improving the range of motion but do not use heat for the first two weeks following surgery (sometimes heat can increase post-operative swelling).  ° °These exercises can be done on a training (exercise) mat, on the floor, on a table or on a bed. Use whatever works the best and is most comfortable for you.    Use music or television while you are exercising so that   the exercises are a pleasant break in your day. This will make your life better with the exercises acting as a break in your routine that you can look forward to.   Perform all exercises about fifteen times, three times per day or as directed.  You should exercise both the operative leg and the other leg as well. ° °Exercises include: °  °• Quad Sets - Tighten up the muscle on the front of the thigh (Quad) and hold for 5-10 seconds.   °• Straight Leg Raises - With your knee straight (if you were given a brace, keep it on),  lift the leg to 60 degrees, hold for 3 seconds, and slowly lower the leg.  Perform this exercise against resistance later as your leg gets stronger.  °• Leg Slides: Lying on your back, slowly slide your foot toward your buttocks, bending your knee up off the floor (only go as far as is comfortable). Then slowly slide your foot back down until your leg is flat on the floor again.  °• Angel Wings: Lying on your back spread your legs to the side as far apart as you can without causing discomfort.  °• Hamstring Strength:  Lying on your back, push your heel against the floor with your leg straight by tightening up the muscles of your buttocks.  Repeat, but this time bend your knee to a comfortable angle, and push your heel against the floor.  You may put a pillow under the heel to make it more comfortable if necessary.  ° °A rehabilitation program following joint replacement surgery can speed recovery and prevent re-injury in the future due to weakened muscles. Contact your doctor or a physical therapist for more information on knee rehabilitation.  ° ° °CONSTIPATION ° °Constipation is defined medically as fewer than three stools per week and severe constipation as less than one stool per week.  Even if you have a regular bowel pattern at home, your normal regimen is likely to be disrupted due to multiple reasons following surgery.  Combination of anesthesia, postoperative narcotics, change in appetite and fluid intake all can affect your bowels.  ° °YOU MUST use at least one of the following options; they are listed in order of increasing strength to get the job done.  They are all available over the counter, and you may need to use some, POSSIBLY even all of these options:   ° °Drink plenty of fluids (prune juice may be helpful) and high fiber foods °Colace 100 mg by mouth twice a day  °Senokot for constipation as directed and as needed Dulcolax (bisacodyl), take with full glass of water  °Miralax (polyethylene glycol)  once or twice a day as needed. ° °If you have tried all these things and are unable to have a bowel movement in the first 3-4 days after surgery call either your surgeon or your primary doctor.   ° °If you experience loose stools or diarrhea, hold the medications until you stool forms back up.  If your symptoms do not get better within 1 week or if they get worse, check with your doctor.  If you experience "the worst abdominal pain ever" or develop nausea or vomiting, please contact the office immediately for further recommendations for treatment. ° ° °ITCHING:  If you experience itching with your medications, try taking only a single pain pill, or even half a pain pill at a time.  You can also use Benadryl over the counter for itching or also to   help with sleep.   TED HOSE STOCKINGS:  Use stockings on both legs until for at least 2 weeks or as directed by physician office. They may be removed at night for sleeping.  MEDICATIONS:  See your medication summary on the After Visit Summary that nursing will review with you.  You may have some home medications which will be placed on hold until you complete the course of blood thinner medication.  It is important for you to complete the blood thinner medication as prescribed.  PRECAUTIONS:  If you experience chest pain or shortness of breath - call 911 immediately for transfer to the hospital emergency department.   If you develop a fever greater that 101 F, purulent drainage from wound, increased redness or drainage from wound, foul odor from the wound/dressing, or calf pain - CONTACT YOUR SURGEON.                                                   FOLLOW-UP APPOINTMENTS:  If you do not already have a post-op appointment, please call the office for an appointment to be seen by your surgeon.  Guidelines for how soon to be seen are listed in your After Visit Summary, but are typically between 1-4 weeks after surgery.  OTHER INSTRUCTIONS:   Knee  Replacement:  Do not place pillow under knee, focus on keeping the knee straight while resting. CPM instructions: 0-90 degrees, 2 hours in the morning, 2 hours in the afternoon, and 2 hours in the evening. Place foam block, curve side up under heel at all times except when in CPM or when walking.  DO NOT modify, tear, cut, or change the foam block in any way.  MAKE SURE YOU:   Understand these instructions.   Get help right away if you are not doing well or get worse.    Thank you for letting us be a part of your medical care team.  It is a privilege we respect greatly.  We hope these instructions will help you stay on track for a fast and full recovery!     Keep hip incision dry. Exercise daily . Call if fever or chills or increased drainage. Go to ER if acutely short of breath or call for ambulance. Return for follow up in 2 weeks. May full weight bear on the surgical leg unless told otherwise. In house walking for first 2 weeks.  Take aspirin daily for anti DVT prophylaxis

## 2016-09-29 NOTE — Progress Notes (Signed)
Occupational Therapy Treatment Patient Details Name: [name] MRN: [id number] DOB: 1955-11-21 Today's Date: 09/29/2016    History of present illness 61 yo female s/p direct anterior hip R LE.  Pt with hx of DM   OT comments  Pt with AE present in the room required for d/c home with spouse. Pt eager to d/c today and pending d/c orders. Pt completed dressing, hand hygiene and basic transfer. Pt reports incr soreness compared to evaluation. Pt is adequate level for d/c home without follow up needs.   Follow Up Recommendations  No OT follow up    Equipment Recommendations  3 in 1 bedside commode;Other (comment)    Recommendations for Other Services      Precautions / Restrictions Precautions Precautions: Fall       Mobility Bed Mobility               General bed mobility comments: in chair on arrival  Transfers Overall transfer level: Needs assistance Equipment used: Rolling walker (2 wheeled) Transfers: Sit to/from Stand Sit to Stand: Supervision         General transfer comment: good recall of hand placement    Balance                                           ADL either performed or assessed with clinical judgement   ADL Overall ADL's : Needs assistance/impaired                 Upper Body Dressing : Modified independent       Toilet Transfer: Supervision/safety;RW           Functional mobility during ADLs: Supervision/safety;Rolling walker General ADL Comments: pt eager to d/c home today and reports her ride can be here in 30 minutes     Vision       Perception     Praxis      Cognition Arousal/Alertness: Awake/alert Behavior During Therapy: WFL for tasks assessed/performed Overall Cognitive Status: Within Functional Limits for tasks assessed                                          Exercises     Shoulder Instructions       General Comments dressing dry and intact. Pt educated no  heat at this time and ice only    Pertinent Vitals/ Pain       Pain Assessment: Faces Faces Pain Scale: Hurts little more Pain Location: R hip/thigh Pain Descriptors / Indicators: Sore Pain Intervention(s): Monitored during session;Premedicated before session;Repositioned  Home Living                                          Prior Functioning/Environment              Frequency  Min 2X/week        Progress Toward Goals  OT Goals(current goals can now be found in the care plan section)  Progress towards OT goals: Goals met/education completed, patient discharged from OT  Acute Rehab OT Goals Patient Stated Goal: return home today by noon OT Goal Formulation: With patient Time For Goal Achievement: 10/12/16 Potential to Achieve  Goals: Good  Plan Discharge plan remains appropriate    Co-evaluation                 AM-PAC PT "6 Clicks" Daily Activity     Outcome Measure   Help from another person eating meals?: None Help from another person taking care of personal grooming?: None Help from another person toileting, which includes using toliet, bedpan, or urinal?: None Help from another person bathing (including washing, rinsing, drying)?: None Help from another person to put on and taking off regular upper body clothing?: None Help from another person to put on and taking off regular lower body clothing?: None 6 Click Score: 24    End of Session Equipment Utilized During Treatment: Rolling walker  OT Visit Diagnosis: Unsteadiness on feet (R26.81)   Activity Tolerance Patient tolerated treatment well   Patient Left in chair;with call bell/phone within reach   Nurse Communication Mobility status;Precautions        Time: 8835-8446 OT Time Calculation (min): 14 min  Charges: OT General Charges $OT Visit: 1 Procedure OT Treatments $Self Care/Home Management : 8-22 mins   Jeri Modena   OTR/L Pager: [phone] Office:  [phone] .    Parke Poisson B 09/29/2016, 8:36 AM

## 2016-09-30 LAB — TYPE AND SCREEN
ABO/RH(D): O POS
Antibody Screen: NEGATIVE

## 2016-10-01 ENCOUNTER — Other Ambulatory Visit: Payer: Self-pay | Admitting: Sports Medicine

## 2016-10-01 ENCOUNTER — Encounter (INDEPENDENT_AMBULATORY_CARE_PROVIDER_SITE_OTHER): Payer: Self-pay | Admitting: Orthopaedic Surgery

## 2016-10-01 ENCOUNTER — Other Ambulatory Visit (HOSPITAL_COMMUNITY): Payer: Self-pay | Admitting: Orthopaedic Surgery

## 2016-10-10 ENCOUNTER — Ambulatory Visit (INDEPENDENT_AMBULATORY_CARE_PROVIDER_SITE_OTHER): Payer: BLUE CROSS/BLUE SHIELD | Admitting: Physician Assistant

## 2016-10-10 DIAGNOSIS — Z96641 Presence of right artificial hip joint: Secondary | ICD-10-CM

## 2016-10-10 MED ORDER — OXYCODONE-ACETAMINOPHEN 5-325 MG PO TABS: 1.0000 | ORAL_TABLET | ORAL | 0 refills | Status: DC | PRN

## 2016-10-10 NOTE — Progress Notes (Signed)
[name] returns today 2 weeks status post right hip arthroplasty. She's having swelling around the incision. Throbbing and some warmth. She also needs refill on her oxycodone. She is on aspirin 325 twice daily. States overall the hip is doing well she does is concerned about the swelling that she is having.  Physical exam right hip: Surgical incisions healing well. She has a large seroma. Right calf supple nontender. Dorsiflexion plantar flexion ankle intact. Range of motion of the hip is somewhat limited with internal rotation. She ambulates without any assistive devices able to get on and off the exam table easily on her own.  Impression: 2 weeks status post right total hip arthroplasty  Plan: Right hips prep with Betadine and ethyl chloride useda skin 105 mL of serosanguineous fluid is removed. Patient tolerates well. She'll continue to work on range of motion strengthening. See her back in 1 month sooner she has any questions concerns. Refill on oxycodone

## 2016-10-10 NOTE — Discharge Summary (Signed)
Patient ID: [name] MRN: [id number] DOB/AGE: [birth year] 62 y.o.  Admit date: 09/27/2016 Discharge date: 10/10/2016  Admission Diagnoses:  Principal Problem:   Avascular necrosis of hip, right (HCC) Active Problems:   Status post total replacement of right hip   Discharge Diagnoses:  Same  Past Medical History:  Diagnosis Date  . Anxiety and depression   . Asthma    related to seasonal allergies  . Diabetes mellitus with complication (HCC)    microalbuminuria 03/2015  . GERD (gastroesophageal reflux disease)   . Hyperlipidemia, mixed 07/2015  . Hypertension   . Vitamin D deficiency     Surgeries: Procedure(s): RIGHT TOTAL HIP ARTHROPLASTY ANTERIOR APPROACH on 09/27/2016   Consultants:   Discharged Condition: Improved  Hospital Course: [name] is an 61 y.o. female who was admitted 09/27/2016 for operative treatment ofAvascular necrosis of hip, right (HCC). Patient has severe unremitting pain that affects sleep, daily activities, and work/hobbies. After pre-op clearance the patient was taken to the operating room on 09/27/2016 and underwent  Procedure(s): RIGHT TOTAL HIP ARTHROPLASTY ANTERIOR APPROACH.    Patient was given perioperative antibiotics:  Anti-infectives    Start     Dose/Rate Route Frequency Ordered Stop   09/27/16 2000  clindamycin (CLEOCIN) IVPB 600 mg     600 mg 100 mL/hr over 30 Minutes Intravenous Every 6 hours 09/27/16 1621 09/28/16 0155   09/27/16 1106  clindamycin (CLEOCIN) IVPB 900 mg     900 mg 100 mL/hr over 30 Minutes Intravenous On call to O.R. 09/27/16 1106 09/27/16 1325       Patient was given sequential compression devices, early ambulation, and chemoprophylaxis to prevent DVT.  Patient benefited maximally from hospital stay and there were no complications.    Recent vital signs: No data found.    Recent laboratory studies: No results for input(s): WBC, HGB, HCT, PLT, NA, K, CL, CO2, BUN, CREATININE, GLUCOSE, INR, CALCIUM in the  last 72 hours.  Invalid input(s): PT, 2   Discharge Medications:   Allergies as of 09/29/2016      Reactions   Penicillins Nausea And Vomiting   Has patient had a PCN reaction causing immediate rash, facial/tongue/throat swelling, SOB or lightheadedness with hypotension: No Has patient had a PCN reaction causing severe rash involving mucus membranes or skin necrosis: No Has patient had a PCN reaction that required hospitalization: No Has patient had a PCN reaction occurring within the last 10 years: No If all of the above answers are "NO", then may proceed with Cephalosporin use.      Medication List    STOP taking these medications   acetaminophen-codeine 300-30 MG tablet Commonly known as:  TYLENOL #3     TAKE these medications   albuterol 108 (90 Base) MCG/ACT inhaler Commonly known as:  PROVENTIL HFA;VENTOLIN HFA Inhale 2 puffs into the lungs every 6 (six) hours as needed for wheezing or shortness of breath.   alendronate 70 MG tablet Commonly known as:  FOSAMAX Take 70 mg by mouth every Saturday. Take with a full glass of water on an empty stomach.   ALPRAZolam 1 MG tablet Commonly known as:  XANAX Take 1 mg by mouth 3 (three) times daily as needed. Anxiety   amLODipine 10 MG tablet Commonly known as:  NORVASC Take 10 mg by mouth daily.   aspirin 81 MG chewable tablet Chew 1 tablet (81 mg total) by mouth 2 (two) times daily.   atenolol 50 MG tablet Commonly known as:  TENORMIN Take 50 mg by mouth daily.   celecoxib 100 MG capsule Commonly known as:  CELEBREX Take 1 capsule (100 mg total) by mouth 2 (two) times daily as needed. What changed:  reasons to take this   cyclobenzaprine 10 MG tablet Commonly known as:  FLEXERIL Take 1 tablet (10 mg total) by mouth 3 (three) times daily as needed for muscle spasms. What changed:  when to take this   cycloSPORINE 0.05 % ophthalmic emulsion Commonly known as:  RESTASIS Place 1 drop into both eyes at bedtime.    escitalopram 10 MG tablet Commonly known as:  LEXAPRO Take 10 mg by mouth daily.   fluticasone 50 MCG/ACT nasal spray Commonly known as:  FLONASE Place 2 sprays into both nostrils daily as needed for allergies.   gabapentin 100 MG capsule Commonly known as:  NEURONTIN Take 1 capsule (100 mg total) by mouth 3 (three) times daily.   gemfibrozil 600 MG tablet Commonly known as:  LOPID Take 1 tablet every AM and 1 tablet every PM. What changed:  how much to take  how to take this  when to take this  additional instructions   losartan 50 MG tablet Commonly known as:  COZAAR Take 1/2 tablet in the morning.   metFORMIN 500 MG tablet Commonly known as:  GLUCOPHAGE Take 250 mg by mouth 2 (two) times daily.   omeprazole 20 MG capsule Commonly known as:  PRILOSEC Take 20 mg by mouth daily.   oxyCODONE-acetaminophen 5-325 MG tablet Commonly known as:  ROXICET Take 1-2 tablets by mouth every 4 (four) hours as needed.   SALONPAS PAIN RELIEF PATCH EX Apply 1 patch topically daily as needed (pain).   sertraline 100 MG tablet Commonly known as:  ZOLOFT Take 100 mg by mouth daily.   traMADol 50 MG tablet Commonly known as:  ULTRAM Take 1 tablet (50 mg total) by mouth every 6 (six) hours as needed for moderate pain. What changed:  when to take this       Diagnostic Studies: Dg Pelvis Portable  Result Date: 09/27/2016 CLINICAL DATA:  Status post right hip replacement EXAM: PORTABLE PELVIS 1-2 VIEWS COMPARISON:  MRI 08/06/2016, radiograph 07/30/2016 FINDINGS: Status post right hip replacement with normal alignment. No fracture. Small ossicle adjacent to the left acetabulum. Subtle sclerosis subarticular left femoral head presumably corresponding to AVN noted on MRI. Soft tissue gas lateral hip consistent with recent postoperative status. IMPRESSION: Status post right hip replacement with expected postsurgical changes Electronically Signed   By: Jasmine Pang M.D.   On:  09/27/2016 15:19   Dg C-arm 1-60 Min-no Report  Result Date: 09/27/2016 Fluoroscopy was utilized by the requesting physician.  No radiographic interpretation.   Dg Hip Operative Unilat With Pelvis Right  Result Date: 09/27/2016 CLINICAL DATA:  Elective surgery. EXAM: OPERATIVE RIGHT HIP (WITH PELVIS IF PERFORMED) 2 VIEWS TECHNIQUE: Fluoroscopic spot image(s) were submitted for interpretation post-operatively. COMPARISON:  07/30/2016 FINDINGS: Interval right hip arthroplasty. No acute hardware complication. No periprosthetic fracture. IMPRESSION: Expected appearance after right hip arthroplasty. Electronically Signed   By: Jeronimo Greaves M.D.   On: 09/27/2016 17:08    Disposition: 01-Home or Self Care  Discharge Instructions    Call MD / Call 911    Complete by:  As directed    If you experience chest pain or shortness of breath, CALL 911 and be transported to the hospital emergency room.  If you develope a fever above 101 F, pus (white drainage) or increased  drainage or redness at the wound, or calf pain, call your surgeon's office.   Constipation Prevention    Complete by:  As directed    Drink plenty of fluids.  Prune juice may be helpful.  You may use a stool softener, such as Colace (over the counter) 100 mg twice a day.  Use MiraLax (over the counter) for constipation as needed.   Diet Carb Modified    Complete by:  As directed    Discharge instructions    Complete by:  As directed    INSTRUCTIONS AFTER JOINT REPLACEMENT   Remove items at home which could result in a fall. This includes throw rugs or furniture in walking pathways ICE to the affected joint every three hours while awake for 30 minutes at a time, for at least the first 3-5 days, and then as needed for pain and swelling.  Continue to use ice for pain and swelling. You may notice swelling that will progress down to the foot and ankle.  This is normal after surgery.  Elevate your leg when you are not up walking on it.    Continue to use the breathing machine you got in the hospital (incentive spirometer) which will help keep your temperature down.  It is common for your temperature to cycle up and down following surgery, especially at night when you are not up moving around and exerting yourself.  The breathing machine keeps your lungs expanded and your temperature down.   DIET:  As you were doing prior to hospitalization, we recommend a well-balanced diet.  DRESSING / WOUND CARE / SHOWERING  Keep the surgical dressing until follow up.  The dressing is water proof, so you can shower without any extra covering.  IF THE DRESSING FALLS OFF or the wound gets wet inside, change the dressing with sterile gauze.  Please use good hand washing techniques before changing the dressing.  Do not use any lotions or creams on the incision until instructed by your surgeon.    ACTIVITY  Increase activity slowly as tolerated, but follow the weight bearing instructions below.   No driving for 6 weeks or until further direction given by your physician.  You cannot drive while taking narcotics.  No lifting or carrying greater than 10 lbs. until further directed by your surgeon. Avoid periods of inactivity such as sitting longer than an hour when not asleep. This helps prevent blood clots.  You may return to work once you are authorized by your doctor.     WEIGHT BEARING   Weight bearing as tolerated with assist device (walker, cane, etc) as directed, use it as long as suggested by your surgeon or therapist, typically at least 4-6 weeks.   EXERCISES  Results after joint replacement surgery are often greatly improved when you follow the exercise, range of motion and muscle strengthening exercises prescribed by your doctor. Safety measures are also important to protect the joint from further injury. Any time any of these exercises cause you to have increased pain or swelling, decrease what you are doing until you are comfortable  again and then slowly increase them. If you have problems or questions, call your caregiver or physical therapist for advice.   Rehabilitation is important following a joint replacement. After just a few days of immobilization, the muscles of the leg can become weakened and shrink (atrophy).  These exercises are designed to build up the tone and strength of the thigh and leg muscles and to improve motion. Often  times heat used for twenty to thirty minutes before working out will loosen up your tissues and help with improving the range of motion but do not use heat for the first two weeks following surgery (sometimes heat can increase post-operative swelling).   These exercises can be done on a training (exercise) mat, on the floor, on a table or on a bed. Use whatever works the best and is most comfortable for you.    Use music or television while you are exercising so that the exercises are a pleasant break in your day. This will make your life better with the exercises acting as a break in your routine that you can look forward to.   Perform all exercises about fifteen times, three times per day or as directed.  You should exercise both the operative leg and the other leg as well.  Exercises include:   Quad Sets - Tighten up the muscle on the front of the thigh (Quad) and hold for 5-10 seconds.   Straight Leg Raises - With your knee straight (if you were given a brace, keep it on), lift the leg to 60 degrees, hold for 3 seconds, and slowly lower the leg.  Perform this exercise against resistance later as your leg gets stronger.  Leg Slides: Lying on your back, slowly slide your foot toward your buttocks, bending your knee up off the floor (only go as far as is comfortable). Then slowly slide your foot back down until your leg is flat on the floor again.  Angel Wings: Lying on your back spread your legs to the side as far apart as you can without causing discomfort.  Hamstring Strength:  Lying on your  back, push your heel against the floor with your leg straight by tightening up the muscles of your buttocks.  Repeat, but this time bend your knee to a comfortable angle, and push your heel against the floor.  You may put a pillow under the heel to make it more comfortable if necessary.   A rehabilitation program following joint replacement surgery can speed recovery and prevent re-injury in the future due to weakened muscles. Contact your doctor or a physical therapist for more information on knee rehabilitation.    CONSTIPATION  Constipation is defined medically as fewer than three stools per week and severe constipation as less than one stool per week.  Even if you have a regular bowel pattern at home, your normal regimen is likely to be disrupted due to multiple reasons following surgery.  Combination of anesthesia, postoperative narcotics, change in appetite and fluid intake all can affect your bowels.   YOU MUST use at least one of the following options; they are listed in order of increasing strength to get the job done.  They are all available over the counter, and you may need to use some, POSSIBLY even all of these options:    Drink plenty of fluids (prune juice may be helpful) and high fiber foods Colace 100 mg by mouth twice a day  Senokot for constipation as directed and as needed Dulcolax (bisacodyl), take with full glass of water  Miralax (polyethylene glycol) once or twice a day as needed.  If you have tried all these things and are unable to have a bowel movement in the first 3-4 days after surgery call either your surgeon or your primary doctor.    If you experience loose stools or diarrhea, hold the medications until you stool forms back up.  If your  symptoms do not get better within 1 week or if they get worse, check with your doctor.  If you experience "the worst abdominal pain ever" or develop nausea or vomiting, please contact the office immediately for further  recommendations for treatment.   ITCHING:  If you experience itching with your medications, try taking only a single pain pill, or even half a pain pill at a time.  You can also use Benadryl over the counter for itching or also to help with sleep.   TED HOSE STOCKINGS:  Use stockings on both legs until for at least 2 weeks or as directed by physician office. They may be removed at night for sleeping.  MEDICATIONS:  See your medication summary on the "After Visit Summary" that nursing will review with you.  You may have some home medications which will be placed on hold until you complete the course of blood thinner medication.  It is important for you to complete the blood thinner medication as prescribed.  PRECAUTIONS:  If you experience chest pain or shortness of breath - call 911 immediately for transfer to the hospital emergency department.   If you develop a fever greater that 101 F, purulent drainage from wound, increased redness or drainage from wound, foul odor from the wound/dressing, or calf pain - CONTACT YOUR SURGEON.                                                   FOLLOW-UP APPOINTMENTS:  If you do not already have a post-op appointment, please call the office for an appointment to be seen by your surgeon.  Guidelines for how soon to be seen are listed in your "After Visit Summary", but are typically between 1-4 weeks after surgery.  OTHER INSTRUCTIONS:   Knee Replacement:  Do not place pillow under knee, focus on keeping the knee straight while resting. CPM instructions: 0-90 degrees, 2 hours in the morning, 2 hours in the afternoon, and 2 hours in the evening. Place foam block, curve side up under heel at all times except when in CPM or when walking.  DO NOT modify, tear, cut, or change the foam block in any way.  MAKE SURE YOU:  Understand these instructions.  Get help right away if you are not doing well or get worse.    Thank you for letting us be a part of your medical  care team.  It is a privilege we respect greatly.  We hope these instructions will help you stay on track for a fast and full recovery!     Keep hip incision dry. Exercise daily . Call if fever or chills or increased drainage. Go to ER if acutely short of breath or call for ambulance. Return for follow up in 2 weeks. May full weight bear on the surgical leg unless told otherwise. In house walking for first 2 weeks.  Take aspirin daily to prevent DVT.   Driving restrictions    Complete by:  As directed    No driving for 3 weeks   Follow the hip precautions as taught in Physical Therapy    Complete by:  As directed    Increase activity slowly as tolerated    Complete by:  As directed    Lifting restrictions    Complete by:  As directed    No lifting for  6 weeks      Follow-up Information    Kathryne Hitch, MD Follow up in 2 week(s).   Specialty:  Orthopedic Surgery Contact information: [address] [place] [address] [phone]        [facility] Follow up.   Specialty:  Home Health Services Why:  Home Health Physical Therapy Contact information: [address] [place] [address] [place] [address] [phone]            Signed: Richardean Canal 10/10/2016, 1:43 PM

## 2016-10-13 ENCOUNTER — Encounter (INDEPENDENT_AMBULATORY_CARE_PROVIDER_SITE_OTHER): Payer: Self-pay | Admitting: Orthopaedic Surgery

## 2016-10-14 ENCOUNTER — Ambulatory Visit (INDEPENDENT_AMBULATORY_CARE_PROVIDER_SITE_OTHER): Payer: BLUE CROSS/BLUE SHIELD | Admitting: Orthopaedic Surgery

## 2016-10-14 DIAGNOSIS — Z96641 Presence of right artificial hip joint: Secondary | ICD-10-CM

## 2016-10-14 NOTE — Progress Notes (Signed)
The patient is returning now 17 days after right total hip arthroplasty. We saw her last week and she's had a reaccumulation of her right hip seroma. There is no evidence infection on examination incision otherwise looks good. I was able to aspirate 80 mL of fluid off of the seroma. At this point she'll still continue increase her activities as she is off all aspirin. I'll have her seen by me in 1 week to see if we need draining at all. She'll continue alternating ice and heat. She can put triple anabolic ointment on her incision.

## 2016-10-18 ENCOUNTER — Encounter: Payer: Self-pay | Admitting: Sports Medicine

## 2016-10-21 ENCOUNTER — Ambulatory Visit (INDEPENDENT_AMBULATORY_CARE_PROVIDER_SITE_OTHER): Payer: BLUE CROSS/BLUE SHIELD | Admitting: Physician Assistant

## 2016-10-21 DIAGNOSIS — Z96641 Presence of right artificial hip joint: Secondary | ICD-10-CM

## 2016-10-21 MED ORDER — OXYCODONE-ACETAMINOPHEN 5-325 MG PO TABS: 1.0000 | ORAL_TABLET | ORAL | 0 refills | Status: DC | PRN

## 2016-10-21 NOTE — Progress Notes (Signed)
[name] returns today 3-1/2 weeks status post right total hip arthroplasty. He states that Saturday her hip seroma again "filling up". Pain worse over the last 2 days. She feels pulling the leg. She is worried about infection.  Right hip: Surgical incision. No abnormal warmth or erythema. She does have a seroma. Calf supple nontender.  Assessment and plan: Right hip is prepped with Betadine and ethyl chloride is used to anesthetize and then 80 mL of blood-tinged aspirates obtained..  Plan: We'll see her back in her regular scheduled appointment. She'll return sooner if there is any signs of infection or recurrent seroma. Questions encouraged and answered

## 2016-10-22 ENCOUNTER — Encounter (INDEPENDENT_AMBULATORY_CARE_PROVIDER_SITE_OTHER): Payer: Self-pay | Admitting: Physician Assistant

## 2016-10-24 ENCOUNTER — Encounter (INDEPENDENT_AMBULATORY_CARE_PROVIDER_SITE_OTHER): Payer: Self-pay | Admitting: Physician Assistant

## 2016-10-26 ENCOUNTER — Encounter (INDEPENDENT_AMBULATORY_CARE_PROVIDER_SITE_OTHER): Payer: Self-pay | Admitting: Orthopaedic Surgery

## 2016-10-28 ENCOUNTER — Ambulatory Visit (INDEPENDENT_AMBULATORY_CARE_PROVIDER_SITE_OTHER): Payer: BLUE CROSS/BLUE SHIELD | Admitting: Orthopaedic Surgery

## 2016-10-28 DIAGNOSIS — Z96641 Presence of right artificial hip joint: Secondary | ICD-10-CM

## 2016-10-28 NOTE — Progress Notes (Signed)
The patient is coming in today is working. She is 1 month out from a right total hip arthroplasty was concerned again about a recurrent seroma. She's had it drained twice now.  On examination there is only a small seroma around her right hip. I prepped this area with Betadine and alcohol and drained only 30 mL of fluid from this area. I that she still may have reaccumulation but I would not drain anymore this standpoint. I want her to back off some of her exercises and just really work on balance and pronation walk around. All questions were encouraged and answered. I'll see her back in 2 weeks as she doing overall but no x-rays are needed.

## 2016-11-06 NOTE — Telephone Encounter (Signed)
Called Neuro at [phone]. They confirmed that study was done 06/12/16 but they could not find the results. They will continue to look and call back.

## 2016-11-06 NOTE — Telephone Encounter (Signed)
Pt called and provided number to Dr. Kathi DerMoore's office to call for nerve conduction study results. Called Dr. Kathi DerMoore's office and advised that requested had been sent for records 08/28/16 and they had not been received. Fax request again to [phone]. I was advised that results will be faxed over ASAP.

## 2016-11-07 ENCOUNTER — Ambulatory Visit (INDEPENDENT_AMBULATORY_CARE_PROVIDER_SITE_OTHER): Payer: BLUE CROSS/BLUE SHIELD | Admitting: Orthopaedic Surgery

## 2016-11-11 ENCOUNTER — Encounter (INDEPENDENT_AMBULATORY_CARE_PROVIDER_SITE_OTHER): Payer: Self-pay | Admitting: Physician Assistant

## 2016-11-11 ENCOUNTER — Other Ambulatory Visit (INDEPENDENT_AMBULATORY_CARE_PROVIDER_SITE_OTHER): Payer: Self-pay

## 2016-11-11 ENCOUNTER — Telehealth (INDEPENDENT_AMBULATORY_CARE_PROVIDER_SITE_OTHER): Payer: Self-pay | Admitting: *Deleted

## 2016-11-11 DIAGNOSIS — M25551 Pain in right hip: Secondary | ICD-10-CM

## 2016-11-11 DIAGNOSIS — Z96641 Presence of right artificial hip joint: Secondary | ICD-10-CM

## 2016-11-11 NOTE — Telephone Encounter (Signed)
[facility]

## 2016-11-11 NOTE — Telephone Encounter (Signed)
Patient called stating she bent down to get something out of the cabinet and she heard a very loud pop on 7/19 and she forgot she had an appointment on this day, patient states her right hip is hurting very bad and she is walking worse now than she did after surgery. Per patient she is continuing to walk and keep ice on it. Please advise [phone]

## 2016-11-12 ENCOUNTER — Other Ambulatory Visit: Payer: Self-pay

## 2016-11-12 ENCOUNTER — Encounter: Payer: Self-pay | Admitting: Sports Medicine

## 2016-11-12 DIAGNOSIS — M25511 Pain in right shoulder: Secondary | ICD-10-CM

## 2016-11-12 DIAGNOSIS — G8929 Other chronic pain: Secondary | ICD-10-CM

## 2016-11-12 DIAGNOSIS — M79641 Pain in right hand: Secondary | ICD-10-CM

## 2016-11-12 DIAGNOSIS — M47812 Spondylosis without myelopathy or radiculopathy, cervical region: Secondary | ICD-10-CM

## 2016-11-13 ENCOUNTER — Encounter (INDEPENDENT_AMBULATORY_CARE_PROVIDER_SITE_OTHER): Payer: Self-pay | Admitting: Orthopaedic Surgery

## 2016-11-16 ENCOUNTER — Other Ambulatory Visit (INDEPENDENT_AMBULATORY_CARE_PROVIDER_SITE_OTHER): Payer: Self-pay | Admitting: Physician Assistant

## 2016-11-18 MED ORDER — HYDROCODONE-ACETAMINOPHEN 5-325 MG PO TABS: 1.0000 | ORAL_TABLET | Freq: Three times a day (TID) | ORAL | 0 refills | Status: DC | PRN

## 2016-11-20 ENCOUNTER — Ambulatory Visit: Payer: BLUE CROSS/BLUE SHIELD | Admitting: Physical Therapy

## 2016-11-23 ENCOUNTER — Ambulatory Visit
Admission: RE | Admit: 2016-11-23 | Discharge: 2016-11-23 | Disposition: A | Discharge status: Home / Self Care | Payer: BLUE CROSS/BLUE SHIELD | Source: Ambulatory Visit | Attending: Sports Medicine | Admitting: Sports Medicine

## 2016-11-23 DIAGNOSIS — M79641 Pain in right hand: Secondary | ICD-10-CM

## 2016-11-23 DIAGNOSIS — G8929 Other chronic pain: Secondary | ICD-10-CM

## 2016-11-23 DIAGNOSIS — M47812 Spondylosis without myelopathy or radiculopathy, cervical region: Secondary | ICD-10-CM

## 2016-11-23 DIAGNOSIS — M25511 Pain in right shoulder: Secondary | ICD-10-CM

## 2016-11-25 ENCOUNTER — Other Ambulatory Visit (INDEPENDENT_AMBULATORY_CARE_PROVIDER_SITE_OTHER): Payer: Self-pay | Admitting: Orthopaedic Surgery

## 2016-11-26 ENCOUNTER — Encounter: Payer: Self-pay | Admitting: Sports Medicine

## 2016-11-26 ENCOUNTER — Ambulatory Visit (INDEPENDENT_AMBULATORY_CARE_PROVIDER_SITE_OTHER): Payer: BLUE CROSS/BLUE SHIELD | Admitting: Sports Medicine

## 2016-11-26 VITALS — BP 128/72 | HR 69 | Ht 70.0 in | Wt 187.8 lb

## 2016-11-26 DIAGNOSIS — M47816 Spondylosis without myelopathy or radiculopathy, lumbar region: Secondary | ICD-10-CM

## 2016-11-26 DIAGNOSIS — M4696 Unspecified inflammatory spondylopathy, lumbar region: Secondary | ICD-10-CM | POA: Diagnosis not present

## 2016-11-26 DIAGNOSIS — M87051 Idiopathic aseptic necrosis of right femur: Secondary | ICD-10-CM | POA: Diagnosis not present

## 2016-11-26 DIAGNOSIS — M4692 Unspecified inflammatory spondylopathy, cervical region: Secondary | ICD-10-CM | POA: Diagnosis not present

## 2016-11-26 DIAGNOSIS — M47812 Spondylosis without myelopathy or radiculopathy, cervical region: Secondary | ICD-10-CM

## 2016-11-26 DIAGNOSIS — Z96641 Presence of right artificial hip joint: Secondary | ICD-10-CM | POA: Diagnosis not present

## 2016-11-26 DIAGNOSIS — G5601 Carpal tunnel syndrome, right upper limb: Secondary | ICD-10-CM | POA: Diagnosis not present

## 2016-11-26 DIAGNOSIS — M160 Bilateral primary osteoarthritis of hip: Secondary | ICD-10-CM | POA: Diagnosis not present

## 2016-11-26 MED ORDER — PREGABALIN 75 MG PO CAPS
75.0000 mg | ORAL_CAPSULE | Freq: Two times a day (BID) | ORAL | 2 refills | Status: DC
Start: 2016-11-26 — End: 2016-12-18

## 2016-11-26 NOTE — Patient Instructions (Addendum)
It was good to see you.  We are going to refer you over to Dr. Alvester MorinNewton for a consultation regarding epidural steroid injection.  You can cut back on your Celebrex to using it just as needed.  You can also discontinue your gabapentin and start on the Lyrica prescription that I provided you today.  I recommend you obtained a compression sleeve to help with your joint problems. There are many options on the market however I recommend obtaining a adjustable groin Body Helix compression sleeve.  You can find information (including how to appropriate measure yourself for sizing) can be found at [email or web address].  Many of these products are health savings account (HSA) eligible.   You can use the compression sleeve at any time throughout the day but is most important to use while being active as well as for 2 hours post-activity.   It is appropriate to ice following activity with the compression sleeve in place.

## 2016-11-26 NOTE — Progress Notes (Signed)
OFFICE VISIT NOTE [name]. [name] Sports Medicine [facility] at [facility] [phone]  [name] - 61 y.o. female MRN [id number]  Date of birth: Jun 26, 1955  Visit Date: 11/26/2016  PCP: Christine Manson, NP   Referred by: Christine Manson, NP  Orlie Dakin, CMA acting as scribe for Dr. Berline Chough.  SUBJECTIVE:   Chief Complaint  Patient presents with  . Follow-up    MRI review   HPI: As below and per problem based documentation when appropriate.  [name] is an established patient coming in today to discuss the results of her recent MRI c-spine.  Results are as follows:  EXAM: MRI CERVICAL SPINE WITHOUT CONTRAST IMPRESSION: 1. C4-5 and C5-6 advanced left and moderate right foraminal stenosis primarily from uncovertebral spurring. 2. Disc osteophyte complexes cause mild spinal stenosis from C3-4 to C5-6. 3. C2-3 and C3-4 mild left foraminal narrowing from uncovertebral Spurs.  Pt had total hip replacement of the right hip since her last visit. She has had to have fluid drawn from the hip joint several times since the surgery. She feels like fluid is continuing to build up. As far as her neck, the irritation has improved some since her last visit. She still has some pain on the right side of her neck and in her right shoulder. She had rotator cuff surgery on the right shoulder years ago. She still has some tingling in the right hand. She has been doing to PT at [facility], she had dry needling done and got some relief with that.     Review of Systems  Constitutional: Negative for chills, fever and weight loss.  Respiratory: Negative for shortness of breath and wheezing.   Cardiovascular: Negative for chest pain, palpitations and leg swelling.  Musculoskeletal: Positive for joint pain and neck pain. Negative for falls.  Neurological: Positive for tingling. Negative for dizziness and headaches.  Endo/Heme/Allergies: Does not bruise/bleed easily.     Otherwise per HPI.  HISTORY & PERTINENT PRIOR DATA:  No specialty comments available. She reports that she quit smoking about 8 months ago. Her smoking use included Cigarettes. She has never used smokeless tobacco.   Recent Labs  07/30/16 1012 09/18/16 0830  HGBA1C  --  6.4*  LABURIC 4.6  --    Medications & Allergies reviewed per EMR Patient Active Problem List   Diagnosis Date Noted  . Carpal tunnel syndrome 12/15/2016  . Avascular necrosis of hip, right (HCC) 09/27/2016  . Status post total replacement of right hip 09/27/2016  . AVN of femur (HCC) 07/30/2016  . Polyarthralgia 07/30/2016  . Chronic right shoulder pain 07/30/2016  . Right hand pain 07/30/2016  . Facet arthritis of lumbar region (HCC) 05/20/2016  . Facet arthritis of cervical region (HCC) 05/20/2016  . Primary osteoarthritis of both hips 12/18/2015  . Chronic bronchitis (HCC) 09/25/2015  . Essential hypertension 09/25/2015  . GAD (generalized anxiety disorder) 09/25/2015  . Gastroesophageal reflux disease without esophagitis 09/25/2015  . Hyperlipemia 09/25/2015  . Type 2 diabetes mellitus with complication (HCC) 09/25/2015   Past Medical History:  Diagnosis Date  . Anxiety and depression   . Asthma    related to seasonal allergies  . Diabetes mellitus with complication (HCC)    microalbuminuria 03/2015  . GERD (gastroesophageal reflux disease)   . Hyperlipidemia, mixed 07/2015  . Hypertension   . Vitamin D deficiency    No family history on file. Past Surgical History:  Procedure Laterality Date  .  FOOT SURGERY    . INCONTINENCE SURGERY    . SHOULDER SURGERY    . TONSILLECTOMY    . TOTAL HIP ARTHROPLASTY Right 09/27/2016   Procedure: RIGHT TOTAL HIP ARTHROPLASTY ANTERIOR APPROACH;  Surgeon: Kathryne HitchBlackman, Christopher Y, MD;  Location: [facility];  Service: Orthopedics;  Laterality: Right;   Social History   Occupational History  . Not on file.   Social History Main Topics  . Smoking status: Former  Smoker    Types: Cigarettes    Quit date: 04/15/2016  . Smokeless tobacco: Never Used  . Alcohol use Yes     Comment: occasional  . Drug use: No  . Sexual activity: Not on file    OBJECTIVE:  VS:  HT:5\' 10"  (177.8 cm)   WT:187 lb 12.8 oz (85.2 kg)  BMI:27    BP:128/72  HR:69bpm  TEMP: ( )  RESP:96 % EXAM: Findings:  Adult female.  No acute distress.  Alert and appropriate.  Quite pleasant and happy with how she is progressing following her surgery.  She is walking with only a minimal limp. she does have a small postsurgical seroma over the right hip but this is mild.  She is only minimal discomfort with palpation.  There is no surrounding erythema wound breakdown or drainage.  Bilateral upper extremities are overall well aligned she has pain with arm squeeze test as well as  Brachial plexus squeeze on the right greater than left.  Is negative Hoffman sign and upper extremity strength is overall well-maintained.     Mr Cervical Spine Wo Contrast Result Date: 11/23/2016 CLINICAL DATA:  Chronic neck, shoulder, arm, and hand pain. Extremity symptoms are on the right. EXAM: MRI CERVICAL SPINE WITHOUT CONTRAST TECHNIQUE: Multiplanar, multisequence MR imaging of the cervical spine was performed. No intravenous contrast was administered. COMPARISON:  None. FINDINGS: Alignment: Physiologic. Vertebrae: No fracture, evidence of discitis, or bone lesion. Cord: Normal signal and morphology. Posterior Fossa, vertebral arteries, paraspinal tissues: Incidental vessels versus subcentimeter cystic spaces in the right parotid tail. Disc levels: C2-3: Asymmetric left uncovertebral spurring with mild left foraminal stenosis. Patent spinal canal C3-4: Disc narrowing with endplate and uncovertebral ridging. Asymmetric left facet spurring, moderate. Partial ventral subarachnoid space effacement. No cord compression. Mild left foraminal stenosis. C4-5: Degenerative disc narrowing with asymmetric left uncovertebral  ridging. Mild facet spurring. Advanced left and moderate right foraminal stenosis. Mild spinal stenosis, there is ventral subarachnoid space effacement without cord impingement C5-6: Degenerative disc narrowing with endplate and left more than right uncovertebral ridging. Negative facets. Advanced left and moderate right foraminal stenosis. Ventral subarachnoid space effacement. C6-7: Small central disc protrusion. Negative facets. Patent canal and foramina C7-T1:Unremarkable. IMPRESSION: 1. C4-5 and C5-6 advanced left and moderate right foraminal stenosis primarily from uncovertebral spurring. 2. Disc osteophyte complexes cause mild spinal stenosis from C3-4 to C5-6. 3. C2-3 and C3-4 mild left foraminal narrowing from uncovertebral spurs. Electronically Signed   By: Marnee SpringJonathon  Watts M.D.   On: 11/23/2016 10:24   ASSESSMENT & PLAN:     ICD-10-CM   1. Facet arthritis of cervical region [facility](HCC) M46.92 Ambulatory referral to Physical Medicine Rehab  2. Facet arthritis of lumbar region (HCC) M46.96   3. Primary osteoarthritis of both hips M16.0   4. Avascular necrosis of right femur (HCC) M87.051   5. Status post total replacement of right hip Z96.641   6. Carpal tunnel syndrome of right wrist G56.01   ================================================================= Status post total replacement of right hip Overall she is doing  quite well.  She has a postsurgical seroma that has been aspirated by Dr. Magnus Ivan previously that is only mildly symptomatic for her today.  Recommend continued watchful waiting given the fact this is been drained several times now and trial of compression to see if this can be beneficial in reducing the amount of reaccumulation.  Defer to Dr. Magnus Ivan for further management but I am happy to perform a ultrasound-guided aspiration of this is persistently symptomatic.  Primary osteoarthritis of both hips No significant pain reported of the left hip at this time.  Facet arthritis  of cervical region [facility]) Patient has had long-standing issues with right arm hand and shoulder pain is most likely related to her underlying cervical pathology.  She has had nerve conduction studies with the EMGs at [facility] and the studies were finally obtained and have been scanned into the media tab.  These do show  a moderate median nerve neuropathy without evidence of cervical radiculopathy.  I would like for her to see Dr. Alvester Morin for consultation regarding a potential cervical epidural injection versus facet injections.  We will plan to follow-up in 8 weeks to see how she is doing after injection.  Carpal tunnel syndrome Symptoms at this time are consistent with cervical radiculitis with likely some double crush phenomenon.  Her symptoms in her hands are only minimal but they are present.  It is more so her shoulder that is a problem for her.  Would like to start by addressing the cervical spondylosis with radiculitis that she has knowing that the hand symptoms may not improve  entirely but I would like for this to perform a diagnostic standpoint given once again it is the upper arm that is more symptomatic for her.  We can consider ultrasound-guided Hydro dissection if she is having persistent hand symptoms. ================================================================= Patient Instructions  It was good to see you.  We are going to refer you over to Dr. Alvester Morin for a consultation regarding epidural steroid injection.  You can cut back on your Celebrex to using it just as needed.  You can also discontinue your gabapentin and start on the Lyrica prescription that I provided you today.  I recommend you obtained a compression sleeve to help with your joint problems. There are many options on the market however I recommend obtaining a adjustable groin Body Helix compression sleeve.  You can find information (including how to appropriate measure yourself for sizing) can be found at www.[employer].ch.   Many of these products are health savings account (HSA) eligible.   You can use the compression sleeve at any time throughout the day but is most important to use while being active as well as for 2 hours post-activity.   It is appropriate to ice following activity with the compression sleeve in place.   =================================================================  Follow-up: Return in about 3 months (around 02/26/2017).   CMA/ATC served as Neurosurgeon during this visit. History, Physical, and Plan performed by medical provider. Documentation and orders reviewed and attested to.      Gaspar Bidding, DO    [facility] Physician

## 2016-11-27 ENCOUNTER — Encounter: Payer: Self-pay | Admitting: Sports Medicine

## 2016-11-27 ENCOUNTER — Ambulatory Visit: Payer: BLUE CROSS/BLUE SHIELD | Admitting: Sports Medicine

## 2016-11-27 ENCOUNTER — Other Ambulatory Visit: Payer: Self-pay

## 2016-11-27 MED ORDER — GABAPENTIN 100 MG PO CAPS: 100.0000 mg | ORAL_CAPSULE | Freq: Three times a day (TID) | ORAL | 0 refills | Status: DC

## 2016-11-28 NOTE — Telephone Encounter (Signed)
Dr. Berline Choughigby, coverage of Lyrica has been denied, please review fax from insurance company and advised.

## 2016-11-29 NOTE — Telephone Encounter (Signed)
Per Dr. Berline Choughigby, send in Cymbalta 75 mg and continue Gabapentin. If no improvement call back and will attend PA again.

## 2016-11-29 NOTE — Telephone Encounter (Signed)
Called and left VM for pt to call the office.  

## 2016-12-02 ENCOUNTER — Other Ambulatory Visit: Payer: Self-pay

## 2016-12-02 MED ORDER — CYCLOBENZAPRINE HCL 10 MG PO TABS: 10.0000 mg | ORAL_TABLET | Freq: Three times a day (TID) | ORAL | 0 refills | Status: DC | PRN

## 2016-12-02 MED ORDER — GABAPENTIN 100 MG PO CAPS: 100.0000 mg | ORAL_CAPSULE | Freq: Three times a day (TID) | ORAL | 0 refills | Status: DC

## 2016-12-02 MED ORDER — DULOXETINE HCL 30 MG PO CPEP: 30.0000 mg | ORAL_CAPSULE | Freq: Every day | ORAL | 0 refills | Status: DC

## 2016-12-02 NOTE — Telephone Encounter (Signed)
-   Cymbalta 30 mg, not 75 mg.   Pt returned call and was advised. She says that she has been in a lot of pain and she is also out of her Flexeril which was originally prescribed by [facility]. Rx refilled and new rx sent to pharmacy. She will be in touch if she gets no relief.

## 2016-12-11 ENCOUNTER — Encounter (INDEPENDENT_AMBULATORY_CARE_PROVIDER_SITE_OTHER): Payer: Self-pay | Admitting: Physical Medicine and Rehabilitation

## 2016-12-11 ENCOUNTER — Ambulatory Visit (INDEPENDENT_AMBULATORY_CARE_PROVIDER_SITE_OTHER): Payer: BLUE CROSS/BLUE SHIELD | Admitting: Physical Medicine and Rehabilitation

## 2016-12-11 VITALS — BP 124/80 | HR 60

## 2016-12-11 DIAGNOSIS — M5412 Radiculopathy, cervical region: Secondary | ICD-10-CM | POA: Diagnosis not present

## 2016-12-11 NOTE — Progress Notes (Deleted)
Neck pain, right shoulder pain. Pain has gotten better recently, but has been resting more. Has numbness in first, second, and third fingers of right hand. This comes and goes. Wears a brace at night which helps when the hand pain/ numbness is really bad. Usually has some numbness in hand every morning.

## 2016-12-12 ENCOUNTER — Encounter (INDEPENDENT_AMBULATORY_CARE_PROVIDER_SITE_OTHER): Payer: Self-pay | Admitting: Physical Medicine and Rehabilitation

## 2016-12-12 NOTE — Progress Notes (Signed)
[name] - 61 y.o. female MRN [id number]  Date of birth: [birth year]  Office Visit Note: Visit Date: 12/11/2016 PCP: April Manson, NP Referred by: April Manson, NP  Subjective: Chief Complaint  Patient presents with  . Neck - Pain  . Right Shoulder - Pain  . Right Hand - Pain, Numbness   HPI: [name] is a seated right-hand dominant female with complicated history of diabetes, depression and anxiety and chronic pain as well as avascular necrosis of the hip status post hip replacement by Dr. Magnus Ivan in our office. She has had history of over a year of right shoulder pain with pain referring from the lateral right neck through the shoulder and the arm with numbness and tingling into the right hand in the median nerve distribution or C6 distribution. She was diagnosed with before meals joint arthritis and receives an before meals joint injection back in February by Dr. Christell Constant with [facility]. He ended up ordering electrodiagnostic study of the right upper extremity as he also diagnosed her with probable carpal tunnel syndrome. He performed a carpal compression test which was positive. Electrodiagnostic study was performed on June 12 2016  By a [facility] neurologist Dr. Dara Lords. Unfortunately when I try to review that report it really is unobtainable on Epic. According to the patient Dr. Berline Chough has been unable to obtain as well. She cannot remember it and thought that Dr. Berline Chough and ordered the electrodiagnostic study but it was actually Dr. Christell Constant. Nonetheless she's been having this ongoing symptom for quite a while. Dr. Berline Chough is thoroughly evaluated her and ultimately did get an MRI of the cervical spine which is reviewed below. The radiologist's impression is C4-5 and C5-6 advanced left and moderate right foraminal stenosis. Only mild stenosis really report. I did go over the MRI with the patient today and it does appear to C5-6 level there is disc osteophyte and narrowing  particularly with the left C6 nerve root exits. There is some right-sided narrowing as well. She has failed conservative care otherwise including medications and physical therapy and injections into the shoulder. Medications continue to include duloxetine and combinations at times of Lyrica and gabapentin as well as muscle relaxer. Her biggest complaint really is however the right shoulder pain worse with movement. She denies any left-sided complaints. She has not had prior cervical surgery. She has not had prior cervical interventional injection.    Review of Systems  Constitutional: Negative for chills, fever, malaise/fatigue and weight loss.  HENT: Negative for hearing loss and sinus pain.   Eyes: Negative for blurred vision, double vision and photophobia.  Respiratory: Negative for cough and shortness of breath.   Cardiovascular: Negative for chest pain, palpitations and leg swelling.  Gastrointestinal: Negative for abdominal pain, nausea and vomiting.  Genitourinary: Negative for flank pain.  Musculoskeletal: Positive for joint pain and neck pain. Negative for myalgias.  Skin: Negative for itching and rash.  Neurological: Positive for tingling. Negative for tremors, focal weakness and weakness.  Endo/Heme/Allergies: Negative.   Psychiatric/Behavioral: Negative for depression.  All other systems reviewed and are negative.  Otherwise per HPI.  Assessment & Plan: Visit Diagnoses:  1. Cervical radiculopathy     Plan: Findings:  Complicated chronic pain patient with diabetes and anxiety depression with history of avascular necrosis of her hips now with chronic worsening severe right shoulder pain which appears to be somewhat mechanical and worsening with movement. On top of this she has some referral pattern from the  neck to the first 3 digits which could be a C6 radiculitis radiculopathy. Exam is really benign in terms of strength losses is really good strength throughout on both sides.  There is a negative Hoffmann's test bilaterally and good reflexes. Nonetheless I think this could be a C6 radiculitis. However this could be a double crush phenomenon with concomitant carpal tunnel syndrome which would be more common. With an equivocal Tinel sign an equivocal Phalen's. It would be paramount to get the electrodiagnostic study to review how significant the carpal tunnel syndrome might be. If it was moderate or more than this may be causing a lot of her shoulder pain referral as well. I did not see where a diagnostic carpal tunnel injection had been performed. At this point I think given her neck pain and shoulder pain and arm pain he would be wise to complete a diagnostic and hopefully therapeutic cervical epidural injection. We discussed this at length with the patient and she does want to proceed.  Depending on the results of the injection it would be paramount to see the electrodiagnostic study to look at carpal tunnel syndrome for her hand numbness.     Meds & Orders: No orders of the defined types were placed in this encounter.  No orders of the defined types were placed in this encounter.   Follow-up: Return for Right C7-T1 intralaminar epidural steroid injection.   Procedures: No procedures performed  No notes on file   Clinical History: MRI CERVICAL SPINE WITHOUT CONTRAST  TECHNIQUE: Multiplanar, multisequence MR imaging of the cervical spine was performed. No intravenous contrast was administered.  COMPARISON:  None.  FINDINGS: Alignment: Physiologic.  Vertebrae: No fracture, evidence of discitis, or bone lesion.  Cord: Normal signal and morphology.  Posterior Fossa, vertebral arteries, paraspinal tissues: Incidental vessels versus subcentimeter cystic spaces in the right parotid tail.  Disc levels:  C2-3: Asymmetric left uncovertebral spurring with mild left foraminal stenosis. Patent spinal canal  C3-4: Disc narrowing with endplate and  uncovertebral ridging. Asymmetric left facet spurring, moderate. Partial ventral subarachnoid space effacement. No cord compression. Mild left foraminal stenosis.  C4-5: Degenerative disc narrowing with asymmetric left uncovertebral ridging. Mild facet spurring. Advanced left and moderate right foraminal stenosis. Mild spinal stenosis, there is ventral subarachnoid space effacement without cord impingement  C5-6: Degenerative disc narrowing with endplate and left more than right uncovertebral ridging. Negative facets. Advanced left and moderate right foraminal stenosis. Ventral subarachnoid space effacement.  C6-7: Small central disc protrusion. Negative facets. Patent canal and foramina  C7-T1:Unremarkable.  IMPRESSION: 1. C4-5 and C5-6 advanced left and moderate right foraminal stenosis primarily from uncovertebral spurring. 2. Disc osteophyte complexes cause mild spinal stenosis from C3-4 to C5-6. 3. C2-3 and C3-4 mild left foraminal narrowing from uncovertebral spurs.   Electronically Signed   By: Marnee Spring M.D.   On: 11/23/2016 10:24  She reports that she quit smoking about 7 months ago. Her smoking use included Cigarettes. She has never used smokeless tobacco.   Recent Labs  07/30/16 1012 09/18/16 0830  HGBA1C  --  6.4*  LABURIC 4.6  --     Objective:  VS:  HT:    WT:   BMI:     BP:124/80  HR:60bpm  TEMP: ( )  RESP:  Physical Exam  Constitutional: She is oriented to person, place, and time. She appears well-developed and well-nourished. No distress.  HENT:  Head: Normocephalic and atraumatic.  Nose: Nose normal.  Mouth/Throat: Oropharynx is clear and moist.  Eyes: Pupils are equal, round, and reactive to light. Conjunctivae are normal.  Neck: No thyromegaly present.  Cardiovascular: Regular rhythm and intact distal pulses.   Pulmonary/Chest: Effort normal. No respiratory distress.  Abdominal: She exhibits no distension. There is no  guarding.  Musculoskeletal:  Cervical range of motion is limited with right rotation and extension. There is a negative Spurling's test bilaterally. She does have pain over the before meals joint on the right she has some pain with crossarm test and painful abduction. Strength wise she has intact strength bilaterally particularly with long finger flexion and abduction and wrist extension. She has intact sensation but feels like to light touch there is subjective impaired sensation in a C6 dermatome. It was hard to judge the fourth digit if there was any sensation deficits there. His seem like she had feeling normally on the ulnar side of the fourth digit. She had an equivocal Phalen's and Tinel's. She had a negative Hoffmann's test bilaterally.  Lymphadenopathy:    She has no cervical adenopathy.  Neurological: She is alert and oriented to person, place, and time. She exhibits normal muscle tone. Coordination normal.  Skin: Skin is warm. No rash noted. No erythema.  Psychiatric: She has a normal mood and affect. Her behavior is normal.  Nursing note and vitals reviewed.   Ortho Exam Imaging: No results found.  Past Medical/Family/Surgical/Social History: Medications & Allergies reviewed per EMR Patient Active Problem List   Diagnosis Date Noted  . Avascular necrosis of hip, right (HCC) 09/27/2016  . Status post total replacement of right hip 09/27/2016  . AVN of femur (HCC) 07/30/2016  . Polyarthralgia 07/30/2016  . Chronic right shoulder pain 07/30/2016  . Right hand pain 07/30/2016  . Facet arthritis of lumbar region (HCC) 05/20/2016  . Facet arthritis of cervical region (HCC) 05/20/2016  . Primary osteoarthritis of both hips 12/18/2015  . Chronic bronchitis (HCC) 09/25/2015  . Essential hypertension 09/25/2015  . GAD (generalized anxiety disorder) 09/25/2015  . Gastroesophageal reflux disease without esophagitis 09/25/2015  . Hyperlipemia 09/25/2015  . Type 2 diabetes mellitus with  complication (HCC) 09/25/2015   Past Medical History:  Diagnosis Date  . Anxiety and depression   . Asthma    related to seasonal allergies  . Diabetes mellitus with complication (HCC)    microalbuminuria 03/2015  . GERD (gastroesophageal reflux disease)   . Hyperlipidemia, mixed 07/2015  . Hypertension   . Vitamin D deficiency    History reviewed. No pertinent family history. Past Surgical History:  Procedure Laterality Date  . FOOT SURGERY    . INCONTINENCE SURGERY    . SHOULDER SURGERY    . TONSILLECTOMY    . TOTAL HIP ARTHROPLASTY Right 09/27/2016   Procedure: RIGHT TOTAL HIP ARTHROPLASTY ANTERIOR APPROACH;  Surgeon: Kathryne Hitch, MD;  Location: [facility];  Service: Orthopedics;  Laterality: Right;   Social History   Occupational History  . Not on file.   Social History Main Topics  . Smoking status: Former Smoker    Types: Cigarettes    Quit date: 04/15/2016  . Smokeless tobacco: Never Used  . Alcohol use Yes     Comment: occasional  . Drug use: No  . Sexual activity: Not on file

## 2016-12-15 DIAGNOSIS — G5600 Carpal tunnel syndrome, unspecified upper limb: Secondary | ICD-10-CM | POA: Insufficient documentation

## 2016-12-15 NOTE — Assessment & Plan Note (Signed)
No significant pain reported of the left hip at this time.

## 2016-12-15 NOTE — Assessment & Plan Note (Signed)
Patient has had long-standing issues with right arm hand and shoulder pain is most likely related to her underlying cervical pathology.  She has had nerve conduction studies with the EMGs at [facility] and the studies were finally obtained and have been scanned into the media tab.  These do show  a moderate median nerve neuropathy without evidence of cervical radiculopathy.  I would like for her to see Dr. Alvester Morin for consultation regarding a potential cervical epidural injection versus facet injections.  We will plan to follow-up in 8 weeks to see how she is doing after injection.

## 2016-12-15 NOTE — Assessment & Plan Note (Signed)
Symptoms at this time are consistent with cervical radiculitis with likely some double crush phenomenon.  Her symptoms in her hands are only minimal but they are present.  It is more so her shoulder that is a problem for her.  Would like to start by addressing the cervical spondylosis with radiculitis that she has knowing that the hand symptoms may not improve  entirely but I would like for this to perform a diagnostic standpoint given once again it is the upper arm that is more symptomatic for her.  We can consider ultrasound-guided Hydro dissection if she is having persistent hand symptoms.

## 2016-12-15 NOTE — Assessment & Plan Note (Signed)
Overall she is doing quite well.  She has a postsurgical seroma that has been aspirated by Dr. Magnus Ivan previously that is only mildly symptomatic for her today.  Recommend continued watchful waiting given the fact this is been drained several times now and trial of compression to see if this can be beneficial in reducing the amount of reaccumulation.  Defer to Dr. Magnus Ivan for further management but I am happy to perform a ultrasound-guided aspiration of this is persistently symptomatic.

## 2016-12-16 ENCOUNTER — Encounter (INDEPENDENT_AMBULATORY_CARE_PROVIDER_SITE_OTHER): Payer: Self-pay | Admitting: Physical Medicine and Rehabilitation

## 2016-12-17 ENCOUNTER — Encounter: Payer: Self-pay | Admitting: Sports Medicine

## 2016-12-18 ENCOUNTER — Other Ambulatory Visit: Payer: Self-pay

## 2016-12-19 ENCOUNTER — Ambulatory Visit (INDEPENDENT_AMBULATORY_CARE_PROVIDER_SITE_OTHER): Payer: BLUE CROSS/BLUE SHIELD | Admitting: Physical Medicine and Rehabilitation

## 2016-12-19 ENCOUNTER — Ambulatory Visit (INDEPENDENT_AMBULATORY_CARE_PROVIDER_SITE_OTHER): Payer: Self-pay

## 2016-12-19 ENCOUNTER — Encounter (INDEPENDENT_AMBULATORY_CARE_PROVIDER_SITE_OTHER): Payer: Self-pay | Admitting: Physical Medicine and Rehabilitation

## 2016-12-19 VITALS — BP 128/77 | HR 73

## 2016-12-19 DIAGNOSIS — M5412 Radiculopathy, cervical region: Secondary | ICD-10-CM

## 2016-12-19 MED ORDER — BETAMETHASONE SOD PHOS & ACET 6 (3-3) MG/ML IJ SUSP
12.0000 mg | Freq: Once | INTRAMUSCULAR | Status: AC
  Administered 2016-12-19: 12 mg

## 2016-12-19 MED ORDER — LIDOCAINE HCL (PF) 1 % IJ SOLN: 2.0000 mL | Freq: Once | INTRAMUSCULAR | Status: AC

## 2016-12-19 NOTE — Patient Instructions (Signed)

## 2016-12-19 NOTE — Progress Notes (Deleted)
Patient is here for planned right C7-T1 interlaminar injection. No change in symptoms.

## 2016-12-20 NOTE — Procedures (Signed)
[name] is a [age] year old female that we saw for evaluation for potential cervical spine intervention. She is followed by Dr. Gaspar BiddingMichael Rigby. We did provide her today with a pretty without report of her electrodiagnostic study. I had contacted Dr. Berline Choughigby to the messaging system of the medical record and he did know where the electrodiagnostic study had been placed in scanned in the chart. She was pleased to see the printed out version and we did remark she had moderate carpal tunnel problems and that was likely why her hand was getting numbness and tingling in the radial digits. I still think is wise to complete the diagnostic and hopefully therapeutic cervical injection because she is having more neck and shoulder pain. Depending on the relief she'll follow-up with Dr. Berline Choughigby and they can pursue the carpal tunnel problems.  Cervical Epidural Steroid Injection - Interlaminar Approach with Fluoroscopic Guidance  Patient: [name]      Date of Birth: July 12, 1955 MRN: [id number] PCP: April MansonWhite, Marsha L, NP      Visit Date: 12/19/2016   Universal Protocol:    Date/Time: 12/21/1810:17 PM  Consent Given By: the patient  Position: PRONE  Additional Comments: Vital signs were monitored before and after the procedure. Patient was prepped and draped in the usual sterile fashion. The correct patient, procedure, and site was verified.   Injection Procedure Details:  Procedure Site One Meds Administered:  Meds ordered this encounter  Medications  . lidocaine (PF) (XYLOCAINE) 1 % injection 2 mL  . betamethasone acetate-betamethasone sodium phosphate (CELESTONE) injection 12 mg     Laterality: Right  Location/Site:  C7-T1  Needle size: 20 G  Needle type: Touhy  Needle Placement: Paramedian epidural space  Findings:  -Contrast Used: 1 mL iohexol 180 mg iodine/mL   -Comments: Excellent flow of contrast into the epidural space.  Procedure Details: Using a paramedian approach from the  side mentioned above, the region overlying the inferior lamina was localized under fluoroscopic visualization and the soft tissues overlying this structure were infiltrated with 4 ml. of 1% Lidocaine without Epinephrine. A # 20 gauge, Tuohy needle was inserted into the epidural space using a paramedian approach.  The epidural space was localized using loss of resistance along with lateral and contralateral oblique bi-planar fluoroscopic views.  After negative aspirate for air, blood, and CSF, a 2 ml. volume of Isovue-250 was injected into the epidural space and the flow of contrast was observed. Radiographs were obtained for documentation purposes.   The injectate was administered into the level noted above.  Additional Comments:  The patient tolerated the procedure well Dressing: Band-Aid    Post-procedure details: Patient was observed during the procedure. Post-procedure instructions were reviewed.  Patient left the clinic in stable condition.

## 2016-12-31 ENCOUNTER — Encounter: Payer: Self-pay | Admitting: Sports Medicine

## 2017-01-15 ENCOUNTER — Encounter: Payer: Self-pay | Admitting: Sports Medicine

## 2017-01-15 ENCOUNTER — Other Ambulatory Visit: Payer: Self-pay

## 2017-01-15 MED ORDER — TRAMADOL HCL 50 MG PO TABS: 50.0000 mg | ORAL_TABLET | Freq: Three times a day (TID) | ORAL | 0 refills | Status: DC | PRN

## 2017-01-15 NOTE — Telephone Encounter (Signed)
Called pt and husband advised that she was not home.

## 2017-01-15 NOTE — Telephone Encounter (Signed)
Patient in a lot of pain in different areas of body.  Scheduled appt for Oct 1st at 9:40am.   She's out of meds and wants to now if something can be called in for pain?  Ty,  -LL

## 2017-01-20 ENCOUNTER — Ambulatory Visit: Payer: Self-pay

## 2017-01-20 ENCOUNTER — Ambulatory Visit (INDEPENDENT_AMBULATORY_CARE_PROVIDER_SITE_OTHER): Payer: BLUE CROSS/BLUE SHIELD

## 2017-01-20 ENCOUNTER — Encounter: Payer: Self-pay | Admitting: Sports Medicine

## 2017-01-20 ENCOUNTER — Ambulatory Visit (INDEPENDENT_AMBULATORY_CARE_PROVIDER_SITE_OTHER): Payer: BLUE CROSS/BLUE SHIELD | Admitting: Sports Medicine

## 2017-01-20 VITALS — BP 130/76 | HR 69 | Ht 70.0 in | Wt 194.0 lb

## 2017-01-20 DIAGNOSIS — Z96641 Presence of right artificial hip joint: Secondary | ICD-10-CM | POA: Diagnosis not present

## 2017-01-20 DIAGNOSIS — M79641 Pain in right hand: Secondary | ICD-10-CM | POA: Diagnosis not present

## 2017-01-20 DIAGNOSIS — G5601 Carpal tunnel syndrome, right upper limb: Secondary | ICD-10-CM | POA: Diagnosis not present

## 2017-01-20 DIAGNOSIS — M797 Fibromyalgia: Secondary | ICD-10-CM | POA: Diagnosis not present

## 2017-01-20 DIAGNOSIS — M4696 Unspecified inflammatory spondylopathy, lumbar region: Secondary | ICD-10-CM

## 2017-01-20 DIAGNOSIS — M47816 Spondylosis without myelopathy or radiculopathy, lumbar region: Secondary | ICD-10-CM

## 2017-01-20 NOTE — Assessment & Plan Note (Signed)
Doing well from her hip replacement.  She has only moderate amount of pain over the lateral hip when ambulating for several minutes.  She does have a small persistent seroma but this is improving.  She should continue with compression as tolerated and continue with activities as tolerated.

## 2017-01-20 NOTE — Progress Notes (Addendum)
OFFICE VISIT NOTE [name]. [name] at [facility] [phone]  [name] - 61 y.o. female MRN [id number]  Date of birth: 07/31/1955  Visit Date: 01/20/2017  PCP: April Manson, NP   Referred by: April Manson, NP  Orlie Dakin, CMA acting as scribe for Dr. Berline Chough.  SUBJECTIVE:   Chief Complaint  Patient presents with  . Follow-up    arthritis c-spine, l-spine, bilateral hips   HPI: As below and per problem based documentation when appropriate.  [name] is an established patient presenting  Today in follow-up of arthritis of the C-spine, L-spine, and bilateral hips. She was last seen 11/26/2016. She was referred to Dr. Alvester Morin for epidural injection which was done 12/19/2016. She was advised to d/c Gabapentin and start Lyrica.   Pt reports some relief in neck pain since getting epidural injection. She is still having a lot of worsening pain and numbness in the RT hand. She says that sx have spread into the entire hand and at times her thumb will feel colder than the rest of her pain. She has trouble holding on to things, they fall out of her hands. She has tingling and sensation of bees stinging her in the hand. Pain is daily now. She is still having RT shoulder pain. She has increased pain with any type of repetitive motions. She has noticed consistent weakness and decreased ROM. She was not able to start Lyrica because of insurance coverage. She is currently out of Gabapentin. She has noticed no improvement of sx since starting Cymbalta. She doesn't feel that Gabapentin works very well except it does make it easier for her to sleep. She tried taking tramadol but says that it give her a "funky head feeling", she wakes up with foggy head and is unable to concentrate.     Review of Systems  Constitutional: Negative for chills and fever.  HENT: Positive for congestion.   Respiratory: Positive for shortness of breath and  wheezing.   Cardiovascular: Negative for chest pain and palpitations.  Musculoskeletal: Positive for back pain, joint pain, myalgias and neck pain.  Neurological: Positive for weakness and headaches. Negative for dizziness.  Endo/Heme/Allergies: Does not bruise/bleed easily.    Otherwise per HPI.  HISTORY & PERTINENT PRIOR DATA:  No specialty comments available. She reports that she quit smoking about 9 months ago. Her smoking use included Cigarettes. She has never used smokeless tobacco.   Recent Labs  07/30/16 1012 09/18/16 0830  HGBA1C  --  6.4*  LABURIC 4.6  --    Allergies reviewed per EMR Prior to Admission medications   Medication Sig Start Date End Date Taking? Authorizing Provider  albuterol (PROVENTIL HFA;VENTOLIN HFA) 108 (90 BASE) MCG/ACT inhaler Inhale 2 puffs into the lungs every 6 (six) hours as needed for wheezing or shortness of breath.    Yes [provider]  ALPRAZolam Prudy Feeler) 1 MG tablet Take 1 mg by mouth 3 (three) times daily as needed. Anxiety   Yes [provider]  amLODipine (NORVASC) 10 MG tablet Take 10 mg by mouth daily.   Yes [provider]  atenolol (TENORMIN) 50 MG tablet Take 50 mg by mouth daily.   Yes [provider]  celecoxib (CELEBREX) 100 MG capsule Take 1 capsule (100 mg total) by mouth 2 (two) times daily as needed. Patient taking differently: Take 100 mg by mouth 2 (two) times daily as needed for moderate pain.  07/30/16  Yes Andrena Mews, DO  cyclobenzaprine (FLEXERIL) 10 MG tablet Take 1 tablet (10 mg total) by mouth 3 (three) times daily as needed for muscle spasms. 12/02/16  Yes Andrena Mews, DO  cycloSPORINE (RESTASIS) 0.05 % ophthalmic emulsion Place 1 drop into both eyes at bedtime.   Yes [provider]  DULoxetine (CYMBALTA) 30 MG capsule Take 1 capsule (30 mg total) by mouth daily. 12/02/16  Yes Andrena Mews, DO  escitalopram (LEXAPRO) 10 MG tablet Take 10 mg by mouth daily. 09/18/16   Yes [provider]  fluticasone (FLONASE) 50 MCG/ACT nasal spray Place 2 sprays into both nostrils daily as needed for allergies.    Yes [provider]  gabapentin (NEURONTIN) 100 MG capsule Take 1 capsule (100 mg total) by mouth 3 (three) times daily. 12/02/16  Yes Andrena Mews, DO  gemfibrozil (LOPID) 600 MG tablet Take 1 tablet every AM and 1 tablet every PM. Patient taking differently: Take 300 mg by mouth 2 (two) times daily.  08/20/16  Yes Andrena Mews, DO  losartan (COZAAR) 50 MG tablet Take 1/2 tablet in the morning. 08/20/16  Yes Andrena Mews, DO  metFORMIN (GLUCOPHAGE) 500 MG tablet Take 250 mg by mouth 2 (two) times daily.    Yes [provider]  omeprazole (PRILOSEC) 20 MG capsule Take 20 mg by mouth daily.   Yes [provider]  traMADol (ULTRAM) 50 MG tablet Take 1 tablet (50 mg total) by mouth every 8 (eight) hours as needed for moderate pain. 01/15/17  Yes Andrena Mews, DO   Patient Active Problem List   Diagnosis Date Noted  . Fibromyalgia 01/22/2017  . Carpal tunnel syndrome 12/15/2016  . Avascular necrosis of hip, right (HCC) 09/27/2016  . Status post total replacement of right hip 09/27/2016  . AVN of femur (HCC) 07/30/2016  . Polyarthralgia 07/30/2016  . Chronic right shoulder pain 07/30/2016  . Right hand pain 07/30/2016  . Facet arthritis of lumbar region (HCC) 05/20/2016  . Facet arthritis of cervical region (HCC) 05/20/2016  . Primary osteoarthritis of both hips 12/18/2015  . Chronic bronchitis (HCC) 09/25/2015  . Essential hypertension 09/25/2015  . GAD (generalized anxiety disorder) 09/25/2015  . Gastroesophageal reflux disease without esophagitis 09/25/2015  . Hyperlipemia 09/25/2015  . Type 2 diabetes mellitus with complication (HCC) 09/25/2015   Past Medical History:  Diagnosis Date  . Anxiety and depression   . Asthma    related to seasonal allergies  . Diabetes mellitus with complication (HCC)     microalbuminuria 03/2015  . GERD (gastroesophageal reflux disease)   . Hyperlipidemia, mixed 07/2015  . Hypertension   . Vitamin D deficiency    No family history on file. Past Surgical History:  Procedure Laterality Date  . FOOT SURGERY    . INCONTINENCE SURGERY    . SHOULDER SURGERY    . TONSILLECTOMY    . TOTAL HIP ARTHROPLASTY Right 09/27/2016   Procedure: RIGHT TOTAL HIP ARTHROPLASTY ANTERIOR APPROACH;  Surgeon: Kathryne Hitch, MD;  Location: [facility];  Service: Orthopedics;  Laterality: Right;   Social History   Occupational History  . Not on file.   Social History Main Topics  . Smoking status: Former Smoker    Types: Cigarettes    Quit date: 04/15/2016  . Smokeless tobacco: Never Used  . Alcohol use Yes     Comment: occasional  . Drug use: No  . Sexual activity: Not on file    OBJECTIVE:  VS:  HT:5\' 10"  (177.8 cm)   WT:194 lb (88 kg)  BMI:27.84    BP:130/76  HR:69bpm  TEMP: ( )  RESP:96 % EXAM: Findings:  Adult female.  No acute distress.  Alert and appropriate vital signs are reviewed.  Her bilateral lower extremities overall well aligned.  She has good improved range of motion of her hip.  Bilateral negative straight leg raise.  Lower extremity sensation is intact to light touch.  Right upper extremity is overall well aligned.  She does have some osteoarthritic bossing of the hands consistent with osteoarthritic changes.  Grip strength is intact.  She does have pain in dysesthesia with carpal tunnel compression test and Tinel's.  Positive Phalen's.  With carpal tunnel compression test she does have pain that radiates into the right anterior shoulder.  Overall improved cervical range of motion.  No significant pain with brachial plexus squeeze or arm squeeze test.    RADIOLOGY: DG Lumbar Spine 2-3 Views CLINICAL DATA:  Low back pain for several weeks, no known injury, initial encounter  EXAM: LUMBAR SPINE - 2-3 VIEW  COMPARISON:   None.  FINDINGS: Five lumbar type vertebral bodies are well visualized. Vertebral body height is well maintained. Osteophytic changes are noted throughout the lumbar spine. No anterolisthesis is seen. Mild facet hypertrophic changes are noted. Aortic calcifications are seen without definitive aneurysmal dilatation.  IMPRESSION: Degenerative change without acute abnormality.  Electronically Signed   By: Alcide Clever M.D.   On: 01/20/2017 13:01  ASSESSMENT & PLAN:     ICD-10-CM   1. Facet arthritis of lumbar region [facility] M46.96 DG Lumbar Spine 2-3 Views  2. Right hand pain M79.641 US GUIDED NEEDLE PLACEMENT(NO LINKED CHARGES)  3. Carpal tunnel syndrome of right wrist G56.01   4. Status post total replacement of right hip Z96.641   5. Fibromyalgia M79.7    ================================================================= Right hand pain Her symptoms are multifactorial.  Her prior nerve conduction study did show a moderate degree of median neuropathy and this is correlating with findings on [facility] ultrasound.  The cervical spine pain that she was having previously has significantly improved with epidural steroid injection but I suspect a component of double crush.  We will go ahead and inject her carpal tunnel today and anticipate good improvement in her symptoms that she had almost complete resolution of the anterior shoulder pain that she was having with the anesthetic phase of the carpal tunnel injection.  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   Facet arthritis of lumbar region [facility]) Multilevel degenerative changes of her lumbar spine.  Low back pain has only been moderately problematic for and she has overall been doing quite well since her right total hip replacement.  We will have her continue with therapeutic exercises  previously outlined and continue to keep a close eye on this.  Could consider epidural steroid injection for her back if persistent but would also consider further diagnostic evaluation MRI of the lumbar spine if unclear etiology.  Carpal tunnel syndrome Injected today. Continue cockup wrist brace at night  Status post total replacement of right hip Doing well from her hip replacement.  She has only moderate amount of pain over the lateral hip when ambulating for several minutes.  She does have a small persistent seroma but this is improving.  She should continue with compression as tolerated  and continue with activities as tolerated.  Fibromyalgia Trial of Lyrica indicated to see if this can provide increased control for her systemic pain  PROCEDURE NOTE -  ULTRASOUND GUIDEDINJECTION: Right Median Nerve Hydrodissection & Injection Images were obtained and interpreted by myself, Gaspar Bidding, DO  Images have been saved and stored to PACS system. Images obtained on: GE S7 Ultrasound machine  ULTRASOUND FINDINGS: Thickened median nerve as it enters the carpal tunnel.  Measures 0.17 cm.  Marked perineural swelling.  DESCRIPTION OF PROCEDURE:  The patient's clinical condition is marked by substantial pain and/or significant functional disability. Other conservative therapy has not provided relief, is contraindicated, or not appropriate. There is a reasonable likelihood that injection will significantly improve the patient's pain and/or functional impairment. After discussing the risks, benefits and expected outcomes of the injection and all questions were reviewed and answered, the patient wished to undergo the above named procedure. Verbal consent was obtained. The ultrasound was used to identify the target structure and adjacent neurovascular structures. The skin was then prepped in sterile fashion and the target structure was injected under direct visualization using sterile technique as  below: PREP: Alcohol, Ethel Chloride APPROACH: Ulnar-sided, direct inplane, single injection, 25g 1.5" needle INJECTATE: 1cc 1% lidocaine, 1cc 0.5% marcaine, 1cc  DepoMedrol ASPIRATE: N/A DRESSING: Band-Aid  Post procedural instructions including recommending icing and warning signs for infection were reviewed. This procedure was well tolerated and there were no complications.   IMPRESSION: Succesful US Guided Injection    ================================================================= No future appointments.  Follow-up: Return in about 6 weeks (around 03/03/2017).   CMA/ATC served as Neurosurgeon during this visit. History, Physical, and Plan performed by medical provider. Documentation and orders reviewed and attested to.      Gaspar Bidding, DO    [facility] Phys[facility]

## 2017-01-20 NOTE — Patient Instructions (Signed)

## 2017-01-20 NOTE — Procedures (Signed)
PROCEDURE NOTE -  ULTRASOUND GUIDEDINJECTION: Right Median Nerve Hydrodissection & Injection Images were obtained and interpreted by myself, Gaspar Bidding, DO  Images have been saved and stored to PACS system. Images obtained on: GE S7 Ultrasound machine  ULTRASOUND FINDINGS: Thickened median nerve as it enters the carpal tunnel.  Measures 0.17 cm.  Marked perineural swelling.  DESCRIPTION OF PROCEDURE:  The patient's clinical condition is marked by substantial pain and/or significant functional disability. Other conservative therapy has not provided relief, is contraindicated, or not appropriate. There is a reasonable likelihood that injection will significantly improve the patient's pain and/or functional impairment. After discussing the risks, benefits and expected outcomes of the injection and all questions were reviewed and answered, the patient wished to undergo the above named procedure. Verbal consent was obtained. The ultrasound was used to identify the target structure and adjacent neurovascular structures. The skin was then prepped in sterile fashion and the target structure was injected under direct visualization using sterile technique as below: PREP: Alcohol, Ethel Chloride APPROACH: Ulnar-sided, direct inplane, single injection, 25g 1.5" needle INJECTATE: 1cc 1% lidocaine, 1cc 0.5% marcaine, 1cc  DepoMedrol ASPIRATE: N/A DRESSING: Band-Aid  Post procedural instructions including recommending icing and warning signs for infection were reviewed. This procedure was well tolerated and there were no complications.   IMPRESSION: Succesful US Guided Injection

## 2017-01-20 NOTE — Assessment & Plan Note (Signed)
Injected today. Continue cockup wrist brace at night

## 2017-01-20 NOTE — Assessment & Plan Note (Signed)
Her symptoms are multifactorial.  Her prior nerve conduction study did show a moderate degree of median neuropathy and this is correlating with findings on [facility] ultrasound.  The cervical spine pain that she was having previously has significantly improved with epidural steroid injection but I suspect a component of double crush.  We will go ahead and inject her carpal tunnel today and anticipate good improvement in her symptoms that she had almost complete resolution of the anterior shoulder pain that she was having with the anesthetic phase of the carpal tunnel injection.  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

## 2017-01-20 NOTE — Assessment & Plan Note (Signed)
Multilevel degenerative changes of her lumbar spine.  Low back pain has only been moderately problematic for and she has overall been doing quite well since her right total hip replacement.  We will have her continue with therapeutic exercises previously outlined and continue to keep a close eye on this.  Could consider epidural steroid injection for her back if persistent but would also consider further diagnostic evaluation MRI of the lumbar spine if unclear etiology.

## 2017-01-22 DIAGNOSIS — M797 Fibromyalgia: Secondary | ICD-10-CM | POA: Insufficient documentation

## 2017-01-22 NOTE — Assessment & Plan Note (Signed)
Trial of Lyrica indicated to see if this can provide increased control for her systemic pain

## 2017-01-27 ENCOUNTER — Telehealth: Payer: Self-pay

## 2017-01-27 ENCOUNTER — Other Ambulatory Visit: Payer: Self-pay

## 2017-01-27 MED ORDER — PREGABALIN 75 MG PO CAPS: 75.0000 mg | ORAL_CAPSULE | Freq: Two times a day (BID) | ORAL | 2 refills | Status: DC

## 2017-01-27 MED ORDER — DULOXETINE HCL 30 MG PO CPEP: 30.0000 mg | ORAL_CAPSULE | Freq: Every day | ORAL | 2 refills | Status: DC

## 2017-01-27 NOTE — Telephone Encounter (Signed)
Patient returning missed phone call. Please call patient an advise.

## 2017-01-27 NOTE — Telephone Encounter (Signed)
Called pt and left VM to call the office.  

## 2017-01-27 NOTE — Telephone Encounter (Signed)
Called pt and advised that Lyrica was approved. Stop Gabapentin, continue Cymbalta, start Lyrica. Pt verbalized understanding and is out of Cymbalta. Lyrica called in to pharmacy, Cymbalta sent electronically.

## 2017-01-27 NOTE — Telephone Encounter (Signed)
PA for Lyrica has been approved. Pt should start Lyrica BID, continue Cymbalta, and d/c Gabapentin.

## 2017-02-07 ENCOUNTER — Other Ambulatory Visit: Payer: Self-pay

## 2017-02-07 ENCOUNTER — Telehealth: Payer: Self-pay | Admitting: Sports Medicine

## 2017-02-07 MED ORDER — METHYLPREDNISOLONE 4 MG PO TBPK: ORAL_TABLET | ORAL | 0 refills | Status: DC

## 2017-02-07 NOTE — Telephone Encounter (Signed)
Called pt and left VM to call the office.  

## 2017-02-07 NOTE — Telephone Encounter (Addendum)
Patient hurt her back yesterday by sitting a lot while her husband had surgery. She states that she can walk but is in a lot of pain. She said that she needs some help and directions on what to do. Her husband is being released from the hospital in a few hours. She also wants to know if she should use Ice or heat for the pain. I let her know that Dr. Berline Choughigby will be calling in a sterioid dosepak. [facility] [place] [place] [facility] [address] [phone] (Phone) [phone] (Fax)

## 2017-02-07 NOTE — Telephone Encounter (Signed)
Pt returned call and was advised that rx has been sent in and to alternate ice and heat. Pt verbalized understanding.

## 2017-02-07 NOTE — Telephone Encounter (Deleted)
[facility]  [facility] [place] [place] - [address] [phone] (Phone) [phone] (Fax)

## 2017-02-07 NOTE — Telephone Encounter (Signed)
Medrol dosepak sent to pharmacy

## 2017-02-10 ENCOUNTER — Ambulatory Visit: Payer: BLUE CROSS/BLUE SHIELD | Admitting: Sports Medicine

## 2017-02-28 ENCOUNTER — Other Ambulatory Visit: Payer: Self-pay

## 2017-02-28 ENCOUNTER — Telehealth: Payer: Self-pay | Admitting: Sports Medicine

## 2017-02-28 MED ORDER — CYCLOBENZAPRINE HCL 10 MG PO TABS: 10.0000 mg | ORAL_TABLET | Freq: Three times a day (TID) | ORAL | 0 refills | Status: DC | PRN

## 2017-02-28 MED ORDER — TRAMADOL HCL 50 MG PO TABS: 50.0000 mg | ORAL_TABLET | Freq: Three times a day (TID) | ORAL | 0 refills | Status: DC | PRN

## 2017-02-28 NOTE — Telephone Encounter (Signed)
MEDICATION:  traMADol (ULTRAM) 50 MG tablet cyclobenzaprine (FLEXERIL) 10 MG tablet PHARMACY:   [facility] - [place] - [phone] (Phone) [phone] (Fax)    IS THIS A 90 DAY SUPPLY : N (would like 90 if possible)  IS PATIENT OUT OF MEDICATION: N  IF NOT; HOW MUCH IS LEFT: 1 pill of each  LAST APPOINTMENT DATE: @10 /19/2018  NEXT APPOINTMENT DATE:@Visit  date not found  OTHER COMMENTS:    **Let patient know to contact pharmacy at the end of the day to make sure medication is ready. **  ** Please notify patient to allow 48-72 hours to process**  **Encourage patient to contact the pharmacy for refills or they can [facility] ref[facility]**

## 2017-02-28 NOTE — Telephone Encounter (Signed)
Cyclobenzaprine eprescribed, Tramadol called in to pharmacy.

## 2017-03-17 ENCOUNTER — Ambulatory Visit (INDEPENDENT_AMBULATORY_CARE_PROVIDER_SITE_OTHER): Payer: BLUE CROSS/BLUE SHIELD | Admitting: Licensed Clinical Social Worker

## 2017-03-17 DIAGNOSIS — F331 Major depressive disorder, recurrent, moderate: Secondary | ICD-10-CM

## 2017-03-17 DIAGNOSIS — F411 Generalized anxiety disorder: Secondary | ICD-10-CM
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
## 2017-03-19 ENCOUNTER — Other Ambulatory Visit: Payer: Self-pay | Admitting: Family Medicine

## 2017-03-19 ENCOUNTER — Encounter: Payer: Self-pay | Admitting: Sports Medicine

## 2017-03-19 DIAGNOSIS — Z1231 Encounter for screening mammogram for malignant neoplasm of breast: Secondary | ICD-10-CM

## 2017-03-21 ENCOUNTER — Other Ambulatory Visit: Payer: Self-pay

## 2017-03-21 DIAGNOSIS — M47816 Spondylosis without myelopathy or radiculopathy, lumbar region: Secondary | ICD-10-CM

## 2017-03-21 NOTE — Telephone Encounter (Signed)
We discussed potentially obtaining an MRI of her lumbar spine at her last office visit.  I like to try to set her up for this if possible.  Please schedule follow-up appointment after this is obtained

## 2017-03-24 ENCOUNTER — Encounter (INDEPENDENT_AMBULATORY_CARE_PROVIDER_SITE_OTHER): Payer: Self-pay | Admitting: Physician Assistant

## 2017-03-26 ENCOUNTER — Ambulatory Visit (INDEPENDENT_AMBULATORY_CARE_PROVIDER_SITE_OTHER): Payer: BLUE CROSS/BLUE SHIELD | Admitting: Orthopaedic Surgery

## 2017-04-01 ENCOUNTER — Telehealth: Payer: Self-pay | Admitting: Sports Medicine

## 2017-04-01 NOTE — Telephone Encounter (Signed)
Copied from CRM [id number]. Topic: Quick Communication - See Telephone Encounter >> Apr 01, 2017 10:40 AM Cipriano BunkerLambe, Annette S wrote: CRM for notification. See Telephone encounter for:  Arliss JourneyChandler, [facility] [phone]  Dr. Berline Choughigby prescribed Cymbolta 30 mg capsells , but another dr has her taking Lexapro 10mg  Dr. Kathlen BrunswickMarsha White.  Question if this was known and if ok to fill 04/01/17.

## 2017-04-02 ENCOUNTER — Ambulatory Visit (INDEPENDENT_AMBULATORY_CARE_PROVIDER_SITE_OTHER): Payer: BLUE CROSS/BLUE SHIELD

## 2017-04-02 ENCOUNTER — Encounter (INDEPENDENT_AMBULATORY_CARE_PROVIDER_SITE_OTHER): Payer: Self-pay | Admitting: Orthopaedic Surgery

## 2017-04-02 ENCOUNTER — Ambulatory Visit (INDEPENDENT_AMBULATORY_CARE_PROVIDER_SITE_OTHER): Payer: BLUE CROSS/BLUE SHIELD | Admitting: Orthopaedic Surgery

## 2017-04-02 DIAGNOSIS — M7061 Trochanteric bursitis, right hip: Secondary | ICD-10-CM | POA: Diagnosis not present

## 2017-04-02 DIAGNOSIS — Z96641 Presence of right artificial hip joint: Secondary | ICD-10-CM | POA: Diagnosis not present

## 2017-04-02 DIAGNOSIS — M25551 Pain in right hip: Secondary | ICD-10-CM

## 2017-04-02 MED ORDER — METHYLPREDNISOLONE ACETATE 40 MG/ML IJ SUSP
40.0000 mg | INTRAMUSCULAR | Status: AC | PRN
  Administered 2017-04-02: 40 mg via INTRA_ARTICULAR

## 2017-04-02 MED ORDER — LIDOCAINE HCL 1 % IJ SOLN
3.0000 mL | INTRAMUSCULAR | Status: AC | PRN
  Administered 2017-04-02: 3 mL

## 2017-04-02 NOTE — Progress Notes (Signed)
Office Visit Note   Patient: [name]           Date of Birth: 11/17/1955           MRN: [id number] Visit Date: 04/02/2017              Requested by: April MansonWhite, Marsha L, NP [address] [place], [place] [address] PCP: April MansonWhite, Marsha L, NP   Assessment & Plan: Visit Diagnoses:  1. Pain in right hip   2. Status post total replacement of right hip   3. Trochanteric bursitis of right hip     Plan: Based on clinical exam this seems to be more of a trochanteric bursitis.  I explained this to her in detail.  I showed her stretching exercises no longer try twice daily.  Also offered a steroid injection of the trochanteric area and if you want to have this.  She tolerated the injection well.  We did have a discussion about how this can affect her blood glucose.  At this point I do not need to see her back for 6 months.  All questions and concerns were answered and addressed.  At that visit I would like a repeat low AP pelvis and lateral of her right operative hip.  Follow-Up Instructions: No Follow-up on file.   Orders:  Orders Placed This Encounter  Procedures  . Large Joint Inj  . XR Pelvis 1-2 Views   No orders of the defined types were placed in this encounter.     Procedures: Large Joint Inj: R greater trochanter on 04/02/2017 10:00 AM Indications: pain and diagnostic evaluation Details: 22 G 1.5 in needle, lateral approach  Arthrogram: No  Medications: 3 mL lidocaine 1 %; 40 mg methylPREDNISolone acetate 40 MG/ML Outcome: tolerated well, no immediate complications Procedure, treatment alternatives, risks and benefits explained, specific risks discussed. Consent was given by the patient. Immediately prior to procedure a time out was called to verify the correct patient, procedure, equipment, support staff and site/side marked as required. Patient was prepped and draped in the usual sterile fashion.       Clinical Data: No additional  findings.   Subjective: Chief Complaint  Patient presents with  . Right Hip - Pain  . Lower Back - Pain  The patient comes in today with a 3-week history of right-sided hip and sciatic pain and low back pain.  She points the trochanteric area in the groin the source for pain as well as her sciatic region.  She does have a remote history of a right total hip arthroplasty through direct anterior approach and that surgery was just in June of this year.  She says she walks without an assistive device and is otherwise been doing well.  She is a type II diabetic.  She reports decent blood glucose control.  She does have medications for pain and inflammation as well.  HPI  Review of Systems She currently denies any headache, chest pain, shortness of breath, fever, chills, nausea, vomiting.  Objective: Vital Signs: There were no vitals taken for this visit.  Physical Exam She is alert and oriented x3 and in no acute distress Ortho Exam She is walking without a limp.  I can put her right hip easily through internal and external rotation fully without any significant pain.  She does have some pain on the trochanteric area in the gluteal area but otherwise seems to be doing well.  Her leg lengths are equal. Specialty Comments:  No specialty comments available.  Imaging: Xr Pelvis 1-2 Views  Result Date: 04/02/2017 An AP pelvis shows a right total hip arthroplasty with no acute findings or complicating features.  There is no evidence of prosthetic loosening.    PMFS History: Patient Active Problem List   Diagnosis Date Noted  . Trochanteric bursitis of right hip 04/02/2017  . Fibromyalgia 01/22/2017  . Carpal tunnel syndrome 12/15/2016  . Avascular necrosis of hip, right (HCC) 09/27/2016  . Status post total replacement of right hip 09/27/2016  . AVN of femur (HCC) 07/30/2016  . Polyarthralgia 07/30/2016  . Chronic right shoulder pain 07/30/2016  . Right hand pain 07/30/2016  . Facet  arthritis of lumbar region (HCC) 05/20/2016  . Facet arthritis of cervical region (HCC) 05/20/2016  . Primary osteoarthritis of both hips 12/18/2015  . Chronic bronchitis (HCC) 09/25/2015  . Essential hypertension 09/25/2015  . GAD (generalized anxiety disorder) 09/25/2015  . Gastroesophageal reflux disease without esophagitis 09/25/2015  . Hyperlipemia 09/25/2015  . Type 2 diabetes mellitus with complication (HCC) 09/25/2015   Past Medical History:  Diagnosis Date  . Anxiety and depression   . Asthma    related to seasonal allergies  . Diabetes mellitus with complication (HCC)    microalbuminuria 03/2015  . GERD (gastroesophageal reflux disease)   . Hyperlipidemia, mixed 07/2015  . Hypertension   . Vitamin D deficiency     History reviewed. No pertinent family history.  Past Surgical History:  Procedure Laterality Date  . FOOT SURGERY    . INCONTINENCE SURGERY    . SHOULDER SURGERY    . TONSILLECTOMY    . TOTAL HIP ARTHROPLASTY Right 09/27/2016   Procedure: RIGHT TOTAL HIP ARTHROPLASTY ANTERIOR APPROACH;  Surgeon: Kathryne HitchBlackman, Leoma Folds Y, MD;  Location: [facility];  Service: Orthopedics;  Laterality: Right;   Social History   Occupational History  . Not on file  Tobacco Use  . Smoking status: Former Smoker    Types: Cigarettes    Last attempt to quit: 04/15/2016    Years since quitting: 0.9  . Smokeless tobacco: Never Used  Substance and Sexual Activity  . Alcohol use: Yes    Comment: occasional  . Drug use: No  . Sexual activity: Not on file

## 2017-04-02 NOTE — Telephone Encounter (Signed)
Forwarding to Dr. Berline Choughigby to advise if OK for pt to be ok both Lexapro and Cymbalta.

## 2017-04-02 NOTE — Telephone Encounter (Signed)
This needs to be addressed by her PCP who initiated the Lexapro after she had been started on the Cymbalta.  Either 1 of these medicines are likely appropriate however both of them do have an increased risk of serotonin syndrome and I would like to see her on either minimal doses of both of these with close monitoring or to have her discontinue one or the other.  She should discuss this further with her primary care doctor since this is been the most recent medication change and if they are comfortable with her on both medications at current doses I will defer to their expertise.

## 2017-04-02 NOTE — Telephone Encounter (Signed)
Left message for pt. To call and let us know if she is seeing Kathlen BrunswickMarsha White now as PCP. She needs to get medication regimen straight.

## 2017-04-02 NOTE — Telephone Encounter (Signed)
Spoke with patient and advised. Pt verbalized understanding. She will check with PCP regarding both meds and get back to us via mychart.

## 2017-04-02 NOTE — Telephone Encounter (Signed)
Please advise on this prescription request. Looks like this patient is being seen by a Optician, [facility]ant Provider and Casimiro NeedleMichael Rigby,DO.

## 2017-04-07 ENCOUNTER — Encounter: Payer: Self-pay | Admitting: Sports Medicine

## 2017-04-07 ENCOUNTER — Ambulatory Visit (INDEPENDENT_AMBULATORY_CARE_PROVIDER_SITE_OTHER): Payer: BLUE CROSS/BLUE SHIELD | Admitting: Sports Medicine

## 2017-04-07 ENCOUNTER — Telehealth: Payer: Self-pay | Admitting: *Deleted

## 2017-04-07 VITALS — BP 120/80 | HR 63 | Ht 70.0 in | Wt 195.0 lb

## 2017-04-07 DIAGNOSIS — M7061 Trochanteric bursitis, right hip: Secondary | ICD-10-CM

## 2017-04-07 DIAGNOSIS — G5601 Carpal tunnel syndrome, right upper limb: Secondary | ICD-10-CM

## 2017-04-07 DIAGNOSIS — E559 Vitamin D deficiency, unspecified: Secondary | ICD-10-CM | POA: Diagnosis not present

## 2017-04-07 DIAGNOSIS — Z96641 Presence of right artificial hip joint: Secondary | ICD-10-CM | POA: Diagnosis not present

## 2017-04-07 DIAGNOSIS — E118 Type 2 diabetes mellitus with unspecified complications: Secondary | ICD-10-CM

## 2017-04-07 DIAGNOSIS — M797 Fibromyalgia: Secondary | ICD-10-CM

## 2017-04-07 MED ORDER — TRAMADOL HCL 50 MG PO TABS: 50.0000 mg | ORAL_TABLET | Freq: Three times a day (TID) | ORAL | 0 refills | Status: DC | PRN

## 2017-04-07 NOTE — Telephone Encounter (Signed)
Please see telephone note

## 2017-04-07 NOTE — Telephone Encounter (Signed)
Dr . Berline Choughigby, please see MyChart message and I can contact pt with recommendations.

## 2017-04-07 NOTE — Progress Notes (Signed)
Veverly FellsMichael D. Delorise Shinerigby, DO  [facility] [phone]  [name] - 61 y.o. female MRN [id number]  Date of birth: 21-Jan-1956  Visit Date: 04/07/2017  PCP: April MansonWhite, Marsha L, NP   Referred by: April MansonWhite, Marsha L, NP   Scribe for today's visit: Stevenson ClinchBrandy Coleman, CMA    SUBJECTIVE:  [name] is here for Follow-up (lumbar and cercival spine pain, RT hip pain, fibromyalgia)  Compared to the last office visit, her previously described symptoms have varied in severity. At times her joint and pain will be improved and other times the pain will be a lot worse. She has been experiencing extremely painful cramps in her legs and feet at night.  Current symptoms vary from mild at times to severe at other times to the point where she doesn't even want anyone to touch her.  She had an injection in her hip by Dr. Magnus IvanBlackman and she did get short term relief with this. She was given home exercises which she has been doing when the pain isn't severe.    ROS Reports night time disturbances. Reports night sweats. Denies unexplained weight loss. Denies personal history of cancer. Denies changes in bowel or bladder habits. Denies recent unreported falls. Denies new or worsening dyspnea or wheezing. Reports headaches or dizziness.  Reports numbness, tingling or weakness  In the extremities.  Denies dizziness or presyncopal episodes Denies lower extremity edema     HISTORY & PERTINENT PRIOR DATA:  Prior History reviewed and updated per electronic medical record.  Significant history, findings, studies and interim changes include:  reports that she quit smoking about a year ago. Her smoking use included cigarettes. she has never used smokeless tobacco. Recent Labs    07/30/16 1012 09/18/16 0830  HGBA1C  --  6.4*  LABURIC 4.6  --    No specialty comments available. No problems updated.   OBJECTIVE:  VS:  HT:5\' 10"  (177.8 cm)   WT:195 lb (88.5 kg)   BMI:27.98    BP:120/80  HR:63bpm  TEMP: ( )  RESP:95 %  PHYSICAL EXAM: Constitutional: WDWN, Non-toxic appearing. Psychiatric: Alert & appropriately interactive. Not depressed or anxious appearing. Respiratory: No increased work of breathing. Trachea Midline Eyes: Pupils are equal. EOM intact without nystagmus. No scleral icterus Cardiovascular:  Peripheral Pulses: peripheral pulses symmetrical No clubbing or cyanosis appreciated Capillary Refill is normal, less than 2 seconds No signficant generalized edema/anasarca  Small amount of pain over the right trochanter with overall good range of motion of the hip.  She has no focal swelling in this area.  Hip strength has improved but still T FL predominant recruitment pattern.  Weakness with glute medius testing when isolated.  Bilateral upper extremities are well aligned.  She has no significant pain with carpal tunnel compression test bilaterally.  Grip strength is improved.  She does have pain with brachial plexus squeeze bilaterally no pain with arm squeeze test.  Strength is symmetric.  Mild pain with Spurling's compression but no radiation.  No additional findings.   ASSESSMENT & PLAN:   1. Trochanteric bursitis of right hip   2. Fibromyalgia   3. Carpal tunnel syndrome of right wrist   4. Status post total replacement of right hip   5. Diabetes mellitus with complication (HCC)   6. Vitamin D deficiency    PLAN: She does have generalized hip and shoulder girdle pain and discomfort and would like to check her for potential component of polymyalgia  rheumatica as well as recheck her vitamin D level given this was low in the past.  We will have her continue with tramadol and have her continue working on therapeutic exercises try to increase her activity going forward.  She will follow-up with me as needed for medication refills otherwise will defer to her primary team.    She is waiting to hear back from pain management referral is  previous been placed for this.  No problem-specific Assessment & Plan notes found for this encounter.   ++++++++++++++++++++++++++++++++++++++++++++ Orders & Meds: Orders Placed This Encounter  Procedures  . VITAMIN D 25 Hydroxy (Vit-D Deficiency, Fractures)  . Sedimentation rate  . C-reactive protein    Meds ordered this encounter  Medications  . traMADol (ULTRAM) 50 MG tablet    Sig: Take 1 tablet (50 mg total) by mouth every 8 (eight) hours as needed for moderate pain.    Dispense:  60 tablet    Refill:  0    ++++++++++++++++++++++++++++++++++++++++++++ Follow-up: Return if symptoms worsen or fail to improve.   Pertinent documentation may be included in additional procedure notes, imaging studies, problem based documentation and patient instructions. Please see these sections of the encounter for additional information regarding this visit. CMA/ATC served as Neurosurgeonscribe during this visit. History, Physical, and Plan performed by medical provider. Documentation and orders reviewed and attested to.      Andrena MewsMichael D Syla Devoss, DO    [facility]

## 2017-04-07 NOTE — Telephone Encounter (Signed)
Copied from CRM [id number]. Topic: Inquiry >> Apr 07, 2017  9:28 AM Yvonna Alanisobinson, Andra M wrote: Reason for CRM: Patient called requesting Orlie DakinBrandy Shelton to give her a call back. Patient wants to speak with Gearldine BienenstockBrandy only concerning a medication need. Please call patient at [phone] (H).      Thank You!!!

## 2017-04-07 NOTE — Patient Instructions (Addendum)
It is okay to stop the cyclobenzaprine (flexeril.)  I have sent in a refill on the tramadol.

## 2017-04-07 NOTE — Telephone Encounter (Signed)
Called pt and advised. She will come in today for OV at 1:40.

## 2017-04-07 NOTE — Telephone Encounter (Signed)
Unfortunately until further information is obtained from the MRI that was ordered last month and do not have many additional options to provide her.  She can call to see if this can get moved up and we do need to follow-up with her.  I have not seen her in 2-1/2 months and it sounds like she is significantly worsened.  I am not sure what medication she is on exactly at this time either so it is difficult to make these recommendations without having a full, clear picture.  I am happy to see her before the MRI if she would like to discuss this and to clarify her current medications.  Please call and inform her of the above but at this time I do not have much else to offer her and what she has already been prescribed.  We did discuss referral to pain management months ago and I am glad that she has initiated this but unfortunately it does take time to get into see them.

## 2017-04-08 LAB — SEDIMENTATION RATE: Sed Rate: 5 mm/hr (ref 0–30)

## 2017-04-08 LAB — C-REACTIVE PROTEIN: CRP: 2.6 mg/L (ref <8.0)

## 2017-04-08 LAB — VITAMIN D 25 HYDROXY (VIT D DEFICIENCY, FRACTURES): VITD: 39.51 ng/mL (ref 30.00–100.00)

## 2017-04-08 NOTE — Progress Notes (Signed)
Your inflammation and vitamin D levels are within normal limits which is reassuring to see.  We do not need to consider supplementing your vitamin D level at this time.  Continue with the plan that we have previously discussed

## 2017-04-18 ENCOUNTER — Telehealth: Payer: Self-pay | Admitting: Sports Medicine

## 2017-04-18 NOTE — Telephone Encounter (Signed)
[facility] called and stated that PA for [facility] MRI expires tomorrow and the patient is not scheduled until 12/31 at 12:30. Please obtain a new authorization.

## 2017-04-18 NOTE — Telephone Encounter (Signed)
Referral has been updated and [facility] made aware.

## 2017-04-21 ENCOUNTER — Ambulatory Visit
Admission: RE | Admit: 2017-04-21 | Discharge: 2017-04-21 | Disposition: A | Discharge status: Home / Self Care | Payer: BLUE CROSS/BLUE SHIELD | Source: Ambulatory Visit | Attending: Family Medicine | Admitting: Family Medicine

## 2017-04-21 ENCOUNTER — Encounter: Payer: Self-pay | Admitting: Sports Medicine

## 2017-04-21 ENCOUNTER — Ambulatory Visit
Admission: RE | Admit: 2017-04-21 | Discharge: 2017-04-21 | Disposition: A | Discharge status: Home / Self Care | Payer: BLUE CROSS/BLUE SHIELD | Source: Ambulatory Visit | Attending: Sports Medicine | Admitting: Sports Medicine

## 2017-04-21 DIAGNOSIS — Z1231 Encounter for screening mammogram for malignant neoplasm of breast: Secondary | ICD-10-CM

## 2017-04-21 DIAGNOSIS — M47816 Spondylosis without myelopathy or radiculopathy, lumbar region: Secondary | ICD-10-CM

## 2017-04-23 ENCOUNTER — Other Ambulatory Visit: Payer: Self-pay | Admitting: Sports Medicine

## 2017-04-23 DIAGNOSIS — M47816 Spondylosis without myelopathy or radiculopathy, lumbar region: Secondary | ICD-10-CM

## 2017-04-23 NOTE — Progress Notes (Signed)
Referral has been placed to Dr. Alvester MorinNewton in my chart message has been sent as below.  Your MRI is overall reassuring from the standpoint that it does not appear that any nerves are being pinched.  You do however have significant arthritis in several joints of your back and may benefit from specific joint injections in your back.  I would like for you to follow-up with Dr. Alvester MorinNewton for facet injections at the L5-S1 levels on both sides.  I am always happy to see back to go over these results in further detail if you would like.

## 2017-04-29 ENCOUNTER — Other Ambulatory Visit: Payer: Self-pay

## 2017-04-29 ENCOUNTER — Ambulatory Visit (HOSPITAL_COMMUNITY): Payer: BLUE CROSS/BLUE SHIELD | Admitting: Psychiatry

## 2017-04-29 ENCOUNTER — Encounter (HOSPITAL_COMMUNITY): Payer: Self-pay | Admitting: Psychiatry

## 2017-04-29 VITALS — BP 138/80 | HR 73 | Ht 68.0 in | Wt 197.0 lb

## 2017-04-29 DIAGNOSIS — F331 Major depressive disorder, recurrent, moderate: Secondary | ICD-10-CM | POA: Diagnosis not present

## 2017-04-29 DIAGNOSIS — F411 Generalized anxiety disorder: Secondary | ICD-10-CM

## 2017-04-29 DIAGNOSIS — Z87891 Personal history of nicotine dependence: Secondary | ICD-10-CM

## 2017-04-29 DIAGNOSIS — Z811 Family history of alcohol abuse and dependence: Secondary | ICD-10-CM

## 2017-04-29 DIAGNOSIS — Z818 Family history of other mental and behavioral disorders: Secondary | ICD-10-CM | POA: Diagnosis not present

## 2017-04-29 DIAGNOSIS — Z630 Problems in relationship with spouse or partner: Secondary | ICD-10-CM

## 2017-04-29 DIAGNOSIS — Z638 Other specified problems related to primary support group: Secondary | ICD-10-CM

## 2017-04-29 MED ORDER — BUPROPION HCL ER (SR) 100 MG PO TB12: 100.0000 mg | ORAL_TABLET | Freq: Every day | ORAL | 0 refills | Status: DC

## 2017-04-29 NOTE — Progress Notes (Signed)
Psychiatric Initial Adult Assessment   Patient Identification: [name] MRN:  [id number] Date of Evaluation:  04/29/2017 Referral Source: counsellor Chief Complaint:   Chief Complaint    Anxiety; Establish Care     Visit Diagnosis:    ICD-10-CM   1. Generalized anxiety disorder F41.1   2. Moderate episode of recurrent major depressive disorder (HCC) F33.1   3. Relationship partner unwell Z63.8     History of Present Illness:  62 years old WMF referred by our counsellor for depression and anxiety  She is on cymbalta from her provider and lexapro from her primary care. Takes xanax prn.  Suffering from depression, more so anxiety, worries, stressed ,free floating anxiety, cannot relax. Disturbed by  Her husband who is in chemo for prostate cancer and cant have sex but he is sleeping separately as well  Difficult childhood with parents fighting with each other and her mom had secrets . Both were using alcohol.  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  She was fairly active but with above reasons has been less active, not workin and on disability.  Feels frustrated and dysphoric due to limitations  Has 2 kids, one has alcoholism.     Associated Signs/Symptoms: Depression Symptoms:  depressed mood, fatigue, anxiety, loss of energy/fatigue, disturbed sleep, (Hypo) Manic Symptoms:  Distractibility, Labiality of Mood, Anxiety Symptoms:  Excessive Worry, Psychotic Symptoms:  denies PTSD Symptoms: Had a traumatic exposure:  difficult childhood Hypervigilance:  Yes  Past Psychiatric History: depression  Previous Psychotropic Medications: Yes  Paxil, prozac  wellbutrin made her somehwat hyper . Not know the dose but it helped fatigue  Substance Abuse History in the last 12 months:  Yes.     Alcohol use more so increase amount last year till November. Now have cut down  Consequences of Substance Abuse: Medical Consequences:  depression.  Past Medical History:  Past Medical History:  Diagnosis Date  . Anxiety and depression   . Asthma    related to seasonal allergies  . Avascular necrosis of hip, right (HCC) 09/27/2016  . AVN of femur (HCC) 07/30/2016  . Diabetes mellitus with complication (HCC)    microalbuminuria 03/2015  . GERD (gastroesophageal reflux disease)   . Hyperlipidemia, mixed 07/2015  . Hypertension   . Vitamin D deficiency     Past Surgical History:  Procedure Laterality Date  . FOOT SURGERY    . INCONTINENCE SURGERY    . SHOULDER SURGERY    . TONSILLECTOMY    . TOTAL HIP ARTHROPLASTY Right 09/27/2016   Procedure: RIGHT TOTAL HIP ARTHROPLASTY ANTERIOR APPROACH;  Surgeon: Kathryne Hitch, MD;  Location: [facility];  Service: Orthopedics;  Laterality: Right;    Family Psychiatric History: MOM, DAD : alcohol use Mom ; depressionor bipolar  Family History: History reviewed. No pertinent family history.  Social History:   Social History   Socioeconomic History  . Marital status: Married    Spouse name: None  . Number of children: None  . Years of education: None  . Highest education level: None  Social Needs  . Financial resource strain: None  . Food insecurity - worry: None  . Food insecurity - inability: None  . Transportation needs - medical: None  . Transportation needs - non-medical: None  Occupational History  . None  Tobacco Use  . Smoking status: Former Smoker    Types: Cigarettes    Last attempt to quit: 04/15/2016    Years since quitting: 1.0  . Smokeless tobacco: Never Used  Substance and Sexual Activity  . Alcohol use: Yes    Comment: occasional  . Drug use: No  . Sexual activity: No  Other Topics Concern  . None  Social History Narrative  . None    Additional Social History: Grew up with parents. Difficult as  there was fighting .   Allergies:  No Known Allergies  Metabolic Disorder Labs: Lab Results  Component Value Date   HGBA1C 6.4 (H) 09/18/2016   MPG 137 09/18/2016   No results found for: PROLACTIN No results found for: CHOL, TRIG, HDL, CHOLHDL, VLDL, LDLCALC   Current Medications: Current Outpatient Medications  Medication Sig Dispense Refill  . albuterol (PROVENTIL HFA;VENTOLIN HFA) 108 (90 BASE) MCG/ACT inhaler Inhale 2 puffs into the lungs every 6 (six) hours as needed for wheezing or shortness of breath.     . ALPRAZolam (XANAX) 1 MG tablet Take 1 mg by mouth 3 (three) times daily as needed. Anxiety    . amLODipine (NORVASC) 10 MG tablet Take 10 mg by mouth daily.    Marland Kitchen atenolol (TENORMIN) 50 MG tablet Take 50 mg by mouth daily.    . cyclobenzaprine (FLEXERIL) 10 MG tablet Take 1 tablet (10 mg total) 3 (three) times daily as needed by mouth for muscle spasms. 60 tablet 0  . cycloSPORINE (RESTASIS) 0.05 % ophthalmic emulsion Place 1 drop into both eyes at bedtime.    . DULoxetine (CYMBALTA) 30 MG capsule Take 1 capsule (30 mg total) by mouth daily. 30 capsule 2  . fluticasone (FLONASE) 50 MCG/ACT nasal spray Place 2 sprays into both nostrils daily as needed for allergies.     Marland Kitchen gemfibrozil (LOPID) 600 MG tablet Take 1 tablet every AM and 1 tablet every PM. (Patient taking differently: Take 300 mg by mouth 2 (two) times daily. )    . losartan (COZAAR) 50 MG tablet   1  . metFORMIN (GLUCOPHAGE) 500 MG tablet Take 500 mg by mouth 2 (two) times daily.     Marland Kitchen omeprazole (PRILOSEC) 20 MG capsule Take 20 mg by mouth daily.    . pregabalin (LYRICA) 75 MG capsule Take 1 capsule (75 mg total) by mouth 2 (two) times daily. 60 capsule 2  . traMADol (ULTRAM) 50 MG tablet Take 1 tablet (50 mg total) by mouth every 8 (eight) hours as needed for moderate pain. 60 tablet 0  . buPROPion (WELLBUTRIN SR) 100 MG 12 hr tablet Take 1 tablet (100 mg total) by mouth daily. 30 tablet 0  . celecoxib (CELEBREX)  100 MG capsule Take 1 capsule (100 mg total) by mouth 2 (two) times daily as needed. (Patient not taking: Reported on 04/29/2017) 60 capsule 2   Current Facility-Administered Medications  Medication Dose Route Frequency Provider Last Rate Last Dose  . lidocaine (PF) (XYLOCAINE) 1 % injection 2 mL  2 mL Other Once Tyrell Antonio, MD        Neurologic: Headache: No Seizure: No Paresthesias:Negative  Musculoskeletal: Strength & Muscle Tone: within normal limits Gait & Station: normal Patient leans: no lean  Psychiatric Specialty Exam: Review of Systems  Cardiovascular: Negative for chest pain.  Skin: Negative for rash.  Neurological: Negative for tremors.  Psychiatric/Behavioral: Positive for depression.    Blood pressure 138/80, pulse 73, height 5\' 8"  (1.727 m), weight 197  lb (89.4 kg).Body mass index is 29.95 kg/m.  General Appearance: Fairly Groomed  Eye Contact:  Good  Speech:  Slow  Volume:  Decreased  Mood:  Dysphoric  Affect:  Constricted  Thought Process:  Goal Directed  Orientation:  Full (Time, Place, and Person)  Thought Content:  Rumination  Suicidal Thoughts:  No  Homicidal Thoughts:  No  Memory:  Immediate;   Fair Recent;   Fair  Judgement:  Fair  Insight:  Shallow  Psychomotor Activity:  Normal  Concentration:  Concentration: Fair and Attention Span: Fair  Recall:  [employer]Fair  Fund of Knowledge:Fair  Language: Fair  Akathisia:  Negative  Handed:  Right  AIMS (if indicated):    Assets:  Desire for Improvement  ADL's:  Intact  Cognition: WNL  Sleep:  fair    Treatment Plan Summary: Medication management and Plan as follows  1. Major depression moderate; since she is on cymbalta for fibromyalgia. Can increase to bid and stop lexapro as both increases serotonin. Will add wellbutrin SR small dose of 100mg  since it has helped her energy and mood before 2. GAD: continue cymbalta as above. Avoid xanax unless its prn need 3. Relationship : partner unwell due to  prostate cancer, but is encouraged to hug and cuddle in relationship that she feels may help her depression Recommend therapy to continue to work on coping skills  avoid alcohol  Fu 3-4 weeks or early if needed and increase activities as she can with her limitations.    Thresa RossNadeem Kemaya Dorner, MD 1/8/201911:45 AM

## 2017-04-29 NOTE — Patient Instructions (Signed)
Have a ME time and Worry time.  Consider to increase cymbalta and stop lexapro  Will be adding wellbutrin SR 100

## 2017-04-30 ENCOUNTER — Encounter: Payer: Self-pay | Admitting: Sports Medicine

## 2017-05-07 ENCOUNTER — Other Ambulatory Visit: Payer: Self-pay

## 2017-05-07 ENCOUNTER — Encounter: Payer: Self-pay | Admitting: Sports Medicine

## 2017-05-07 MED ORDER — DULOXETINE HCL 30 MG PO CPEP: 30.0000 mg | ORAL_CAPSULE | Freq: Two times a day (BID) | ORAL | 2 refills | Status: DC

## 2017-05-14 ENCOUNTER — Encounter (INDEPENDENT_AMBULATORY_CARE_PROVIDER_SITE_OTHER): Payer: Self-pay | Admitting: Physical Medicine and Rehabilitation

## 2017-05-14 ENCOUNTER — Ambulatory Visit (HOSPITAL_COMMUNITY): Payer: BLUE CROSS/BLUE SHIELD | Admitting: Licensed Clinical Social Worker

## 2017-05-14 ENCOUNTER — Ambulatory Visit (INDEPENDENT_AMBULATORY_CARE_PROVIDER_SITE_OTHER): Payer: BLUE CROSS/BLUE SHIELD | Admitting: Physical Medicine and Rehabilitation

## 2017-05-14 ENCOUNTER — Ambulatory Visit (INDEPENDENT_AMBULATORY_CARE_PROVIDER_SITE_OTHER): Payer: BLUE CROSS/BLUE SHIELD

## 2017-05-14 VITALS — BP 130/72 | HR 61 | Temp 97.6°F

## 2017-05-14 DIAGNOSIS — M47816 Spondylosis without myelopathy or radiculopathy, lumbar region: Secondary | ICD-10-CM | POA: Diagnosis not present

## 2017-05-14 MED ORDER — METHYLPREDNISOLONE ACETATE 80 MG/ML IJ SUSP
80.0000 mg | Freq: Once | INTRAMUSCULAR | Status: AC
  Administered 2017-05-14: 80 mg

## 2017-05-14 NOTE — Procedures (Signed)
[name] is a 62 year old female who is followed by Dr. Gaspar BiddingMichael Rigby who sends her in today requesting diagnostic bilateral L5-S1 facet joint blocks.  Patient has significant fibromyalgia.  Reports worsening with standing also some with sitting.  Symptoms in the lower back have been ongoing for 3 months.  She reports that she is very unhappy with the current state of opioid controlled medications since she cannot get any pain medicines.  I did let her know that opioid medications for fibromyalgia is really not in the also talked about opioid medications really only for essentially acute to subacute increases in pain.  Lumbar Facet Joint Intra-Articular Injection(s) with Fluoroscopic Guidance  Patient: [name]      Date of Birth: 04/14/1956 MRN: [id number] PCP: April MansonWhite, Marsha L, NP      Visit Date: 05/14/2017   Universal Protocol:    Date/Time: 05/14/2017  Consent Given By: the patient  Position: PRONE   Additional Comments: Vital signs were monitored before and after the procedure. Patient was prepped and draped in the usual sterile fashion. The correct patient, procedure, and site was verified.   Injection Procedure Details:  Procedure Site One Meds Administered:  Meds ordered this encounter  Medications  . methylPREDNISolone acetate (DEPO-MEDROL) injection 80 mg     Laterality: Bilateral  Location/Site:  L5-S1  Needle size: 22 guage  Needle type: Spinal  Needle Placement: Articular  Findings:  -Comments: Excellent flow of contrast producing a partial arthrogram.  Procedure Details: The fluoroscope beam is vertically oriented in AP, and the inferior recess is visualized beneath the lower pole of the inferior apophyseal process, which represents the target point for needle insertion. When direct visualization is difficult the target point is located at the medial projection of the vertebral pedicle. The region overlying each aforementioned target is locally  anesthetized with a 1 to 2 ml. volume of 1% Lidocaine without Epinephrine.   The spinal needle was inserted into each of the above mentioned facet joints using biplanar fluoroscopic guidance. A 0.25 to 0.5 ml. volume of Isovue-250 was injected and a partial facet joint arthrogram was obtained. A single spot film was obtained of the resulting arthrogram.    One to 1.25 ml of the steroid/anesthetic solution was then injected into each of the facet joints noted above.   Additional Comments:  The patient tolerated the procedure well Dressing: Band-Aid    Post-procedure details: Patient was observed during the procedure. Post-procedure instructions were reviewed.  Patient left the clinic in stable condition.  Pertinent Imaging: MRI LUMBAR SPINE WITHOUT CONTRAST  TECHNIQUE: Multiplanar, multisequence MR imaging of the lumbar spine was performed. No intravenous contrast was administered.  COMPARISON:  Lumbar spine radiographs 01/20/2017  FINDINGS: Segmentation: 5 lumbar type vertebral bodies. The last full intervertebral disc space is labeled L5-S1. This correlates with the radiographs.  Alignment:  Normal  Vertebrae:  No bone lesions or fracture.  Conus medullaris and cauda equina: Conus extends to the T12-L1 level. Conus and cauda equina appear normal.  Paraspinal and other soft tissues: No significant findings  Disc levels:  T12-L1:  No significant findings.  L1-2: Mild facet disease but no disc protrusions, spinal or foraminal stenosis.  L2-3:  No significant findings.  L3-4: Mild diffuse annular bulge with slight flattening of the ventral thecal sac and mild bilateral lateral recess encroachment. No spinal or foraminal stenosis. Mild to moderate facet disease.  L4-5: Diffuse annular bulge and shallow central disc protrusion with mild impression on the thecal sac.  There is mild bilateral lateral recess encroachment but no significant spinal or foraminal  stenosis. Mild to moderate facet disease.  L5-S1: Advanced facet disease but no disc protrusions, spinal or foraminal stenosis.  IMPRESSION: 1. Mild bilateral lateral recess encroachment at L3-4 and L4-5. No spinal or foraminal stenosis. 2. Advanced facet disease at L5-S1 but no spinal or foraminal stenosis.   Electronically Signed   By: Rudie Meyer M.D.   On: 04/21/2017 13:30  MRI CERVICAL SPINE WITHOUT CONTRAST  TECHNIQUE: Multiplanar, multisequence MR imaging of the cervical spine was performed. No intravenous contrast was administered.  COMPARISON: None.  FINDINGS: Alignment: Physiologic.  Vertebrae: No fracture, evidence of discitis, or bone lesion.  Cord: Normal signal and morphology.  Posterior Fossa, vertebral arteries, paraspinal tissues: Incidental vessels versus subcentimeter cystic spaces in the right parotid tail.  Disc levels:  C2-3: Asymmetric left uncovertebral spurring with mild left foraminal stenosis. Patent spinal canal  C3-4: Disc narrowing with endplate and uncovertebral ridging. Asymmetric left facet spurring, moderate. Partial ventral subarachnoid space effacement. No cord compression. Mild left foraminal stenosis.  C4-5: Degenerative disc narrowing with asymmetric left uncovertebral ridging. Mild facet spurring. Advanced left and moderate right foraminal stenosis. Mild spinal stenosis, there is ventral subarachnoid space effacement without cord impingement  C5-6: Degenerative disc narrowing with endplate and left more than right uncovertebral ridging. Negative facets. Advanced left and moderate right foraminal stenosis. Ventral subarachnoid space effacement.  C6-7: Small central disc protrusion. Negative facets. Patent canal and foramina  C7-T1:Unremarkable.  IMPRESSION: 1. C4-5 and C5-6 advanced left and moderate right foraminal stenosis primarily from uncovertebral spurring. 2. Disc osteophyte complexes  cause mild spinal stenosis from C3-4 to C5-6. 3. C2-3 and C3-4 mild left foraminal narrowing from uncovertebral spurs.   Electronically Signed By: Marnee Spring M.D. On: 11/23/2016 10:24

## 2017-05-14 NOTE — Patient Instructions (Signed)

## 2017-05-14 NOTE — Progress Notes (Deleted)
Pt states muscle spasms in her left thigh and lower back pain. Pt states lower back pain has been there since since 6 months, left thigh spasm has been going on for 3 months. Pt states cold weather, standing, and sitting too long makes pain worse, nothing makes pain better. Pt states last injection helped. +Driver, -BT, -Dye Allergies. Opioids situation driving her crazy

## 2017-05-16 ENCOUNTER — Telehealth (INDEPENDENT_AMBULATORY_CARE_PROVIDER_SITE_OTHER): Payer: Self-pay | Admitting: Physical Medicine and Rehabilitation

## 2017-05-16 NOTE — Telephone Encounter (Signed)
After speaking with Dr. Alvester MorinNewton, I called the patient and advised her that this sounded like probable muscle irritation. I explained that there are no nerves in the area of the facet joints, and advised her to try gentle stretching and ice. She verbalized understanding.

## 2017-05-16 NOTE — Telephone Encounter (Signed)
Patient left voicemail stating she had injections this week and for the past couple days have experienced tingling and muscle spasms in her lower back. She would like to know If she can do anything for this. Please advise as soon as possible [phone]

## 2017-05-21 ENCOUNTER — Ambulatory Visit (HOSPITAL_COMMUNITY): Payer: BLUE CROSS/BLUE SHIELD | Admitting: Psychiatry

## 2017-05-22 ENCOUNTER — Encounter (INDEPENDENT_AMBULATORY_CARE_PROVIDER_SITE_OTHER): Payer: Self-pay | Admitting: Physical Medicine and Rehabilitation

## 2017-05-22 ENCOUNTER — Encounter: Payer: Self-pay | Admitting: Sports Medicine

## 2017-05-22 NOTE — Progress Notes (Signed)
Please note that the subjective history included in the procedure note included  the briefest conversation between myself and the patient on opioid medications and pain medications and was meant to document that I remarked on when those medication were indicated and when they were not. There was no discussion or insinuation of patient using the medications, asking for the medications or having a problem with the medications.

## 2017-05-23 ENCOUNTER — Ambulatory Visit (INDEPENDENT_AMBULATORY_CARE_PROVIDER_SITE_OTHER): Payer: BLUE CROSS/BLUE SHIELD | Admitting: Psychiatry

## 2017-05-23 ENCOUNTER — Other Ambulatory Visit: Payer: Self-pay

## 2017-05-23 ENCOUNTER — Encounter (HOSPITAL_COMMUNITY): Payer: Self-pay | Admitting: Psychiatry

## 2017-05-23 VITALS — BP 118/76 | HR 67 | Ht 70.0 in | Wt 201.0 lb

## 2017-05-23 DIAGNOSIS — F5102 Adjustment insomnia: Secondary | ICD-10-CM

## 2017-05-23 DIAGNOSIS — F1099 Alcohol use, unspecified with unspecified alcohol-induced disorder: Secondary | ICD-10-CM

## 2017-05-23 DIAGNOSIS — G4700 Insomnia, unspecified: Secondary | ICD-10-CM | POA: Diagnosis not present

## 2017-05-23 DIAGNOSIS — Z818 Family history of other mental and behavioral disorders: Secondary | ICD-10-CM | POA: Diagnosis not present

## 2017-05-23 DIAGNOSIS — Z638 Other specified problems related to primary support group: Secondary | ICD-10-CM

## 2017-05-23 DIAGNOSIS — F411 Generalized anxiety disorder: Secondary | ICD-10-CM | POA: Diagnosis not present

## 2017-05-23 DIAGNOSIS — Z811 Family history of alcohol abuse and dependence: Secondary | ICD-10-CM

## 2017-05-23 DIAGNOSIS — Z87891 Personal history of nicotine dependence: Secondary | ICD-10-CM

## 2017-05-23 DIAGNOSIS — F331 Major depressive disorder, recurrent, moderate: Secondary | ICD-10-CM | POA: Diagnosis not present

## 2017-05-23 DIAGNOSIS — Z630 Problems in relationship with spouse or partner: Secondary | ICD-10-CM

## 2017-05-23 MED ORDER — TRAZODONE HCL 50 MG PO TABS: 50.0000 mg | ORAL_TABLET | Freq: Every day | ORAL | 0 refills | Status: DC

## 2017-05-23 MED ORDER — BUPROPION HCL ER (SR) 100 MG PO TB12: 100.0000 mg | ORAL_TABLET | Freq: Every day | ORAL | 1 refills | Status: DC

## 2017-05-23 NOTE — Progress Notes (Signed)
[facility] Outpatient Follow up visit   Patient Identification: [name] MRN:  [id number] Date of Evaluation:  05/23/2017 Referral Source: counsellor Chief Complaint:   Chief Complaint    Follow-up; Anxiety     Visit Diagnosis:  No diagnosis found.  History of Present Illness:  62 years old WMF referred by our counsellor for depression and anxiety  Last visit was depressed. We stopped lexapro as she was on cymbalta. Added wellbutrin it has helped  she takex xanax at night.  . Disturbed by  Her husband who is in chemo for prostate cancer and cant have sex but he is sleeping separately as well  Difficult childhood with parents fighting with each other and her mom had secrets . Both were using alcohol.  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  Severity improved  timing more at night Sleep remains issue and has to rely on xanax     Past Psychiatric History: depression  Previous Psychotropic Medications: Yes  Paxil, prozac  wellbutrin made her somehwat hyper . Not know the dose but it helped fatigue  Substance Abuse History in the last 12 months:  Yes.    Alcohol use more so increase amount last year till November. Now have cut down  Consequences of Substance Abuse: Medical Consequences:  depression.  Past Medical History:  Past Medical History:  Diagnosis Date  . Anxiety and depression   . Asthma    related to seasonal allergies  . Avascular necrosis of hip, right (HCC) 09/27/2016  . AVN of femur (HCC) 07/30/2016  . Diabetes mellitus with complication (HCC)    microalbuminuria 03/2015  . GERD (gastroesophageal reflux disease)   . Hyperlipidemia, mixed 07/2015  . Hypertension   . Vitamin D deficiency     Past Surgical History:  Procedure Laterality Date  . FOOT SURGERY    . INCONTINENCE SURGERY    . SHOULDER SURGERY    . TONSILLECTOMY    . TOTAL HIP ARTHROPLASTY Right 09/27/2016   Procedure: RIGHT TOTAL HIP ARTHROPLASTY  ANTERIOR APPROACH;  Surgeon: Kathryne Hitch, MD;  Location: [facility];  Service: Orthopedics;  Laterality: Right;    Family Psychiatric History: MOM, DAD : alcohol use Mom ; depressionor bipolar  Family History: History reviewed. No pertinent family history.  Social History:   Social History   Socioeconomic History  . Marital status: Married    Spouse name: None  . Number of children: None  . Years of education: None  . Highest education level: None  Social Needs  . Financial resource strain: None  . Food insecurity - worry: None  . Food insecurity - inability: None  . Transportation needs - medical: None  . Transportation needs - non-medical: None  Occupational History  . None  Tobacco Use  . Smoking status: Former Smoker    Types: Cigarettes    Last attempt to quit: 04/15/2016    Years since quitting: 1.1  . Smokeless tobacco: Never Used  Substance and Sexual Activity  . Alcohol use: Yes    Alcohol/week: 1.2 - 1.8 oz    Types: 2 - 3 Glasses of wine per week    Comment: occasional  . Drug use: No  . Sexual activity: No  Other Topics Concern  . None  Social History Narrative  . None    Additional Social History: Grew up with parents. Difficult as there was fighting .   Allergies:  No Known  Allergies  Metabolic Disorder Labs: Lab Results  Component Value Date   HGBA1C 6.4 (H) 09/18/2016   MPG 137 09/18/2016   No results found for: PROLACTIN No results found for: CHOL, TRIG, HDL, CHOLHDL, VLDL, LDLCALC   Current Medications: Current Outpatient Medications  Medication Sig Dispense Refill  . albuterol (PROVENTIL HFA;VENTOLIN HFA) 108 (90 BASE) MCG/ACT inhaler Inhale 2 puffs into the lungs every 6 (six) hours as needed for wheezing or shortness of breath.     . ALPRAZolam (XANAX) 1 MG tablet Take 1 mg by mouth 3 (three) times daily as needed. Anxiety    . amLODipine (NORVASC) 10 MG tablet Take 10 mg by mouth daily.    Marland Kitchen atenolol (TENORMIN) 50 MG tablet  Take 50 mg by mouth daily.    Marland Kitchen buPROPion (WELLBUTRIN SR) 100 MG 12 hr tablet Take 1 tablet (100 mg total) by mouth daily. 30 tablet 1  . celecoxib (CELEBREX) 100 MG capsule Take 1 capsule (100 mg total) by mouth 2 (two) times daily as needed. 60 capsule 2  . cyclobenzaprine (FLEXERIL) 10 MG tablet Take 1 tablet (10 mg total) 3 (three) times daily as needed by mouth for muscle spasms. 60 tablet 0  . cycloSPORINE (RESTASIS) 0.05 % ophthalmic emulsion Place 1 drop into both eyes at bedtime.    . DULoxetine (CYMBALTA) 30 MG capsule Take 1 capsule (30 mg total) by mouth 2 (two) times daily. 60 capsule 2  . fluticasone (FLONASE) 50 MCG/ACT nasal spray Place 2 sprays into both nostrils daily as needed for allergies.     Marland Kitchen gemfibrozil (LOPID) 600 MG tablet Take 1 tablet every AM and 1 tablet every PM. (Patient taking differently: Take 300 mg by mouth 2 (two) times daily. )    . losartan (COZAAR) 50 MG tablet   1  . metFORMIN (GLUCOPHAGE) 500 MG tablet Take 500 mg by mouth 2 (two) times daily.     Marland Kitchen omeprazole (PRILOSEC) 20 MG capsule Take 20 mg by mouth daily.    . pregabalin (LYRICA) 75 MG capsule Take 1 capsule (75 mg total) by mouth 2 (two) times daily. 60 capsule 2  . traMADol (ULTRAM) 50 MG tablet Take 1 tablet (50 mg total) by mouth every 8 (eight) hours as needed for moderate pain. 60 tablet 0  . traZODone (DESYREL) 50 MG tablet Take 1 tablet (50 mg total) by mouth at bedtime. 30 tablet 0   Current Facility-Administered Medications  Medication Dose Route Frequency Provider Last Rate Last Dose  . lidocaine (PF) (XYLOCAINE) 1 % injection 2 mL  2 mL Other Once Tyrell Antonio, MD          Psychiatric Specialty Exam: Review of Systems  Cardiovascular: Negative for palpitations.  Skin: Negative for rash.  Neurological: Negative for tremors.    Blood pressure 118/76, pulse 67, height 5\' 10"  (1.778 m), weight 201 lb (91.2 kg).Body mass index is 28.84 kg/m.  General Appearance: Fairly Groomed   Eye Contact:  Good  Speech:  Slow  Volume:  Decreased  Mood:  Less depressed  Affect: imrpved  Thought Process:  Goal Directed  Orientation:  Full (Time, Place, and Person)  Thought Content:  Rumination  Suicidal Thoughts:  No  Homicidal Thoughts:  No  Memory:  Immediate;   Fair Recent;   Fair  Judgement:  Fair  Insight:  Shallow  Psychomotor Activity:  Normal  Concentration:  Concentration: Fair and Attention Span: Fair  Recall:  [employer] of Knowledge:Fair  Language:  Fair  Akathisia:  Negative  Handed:  Right  AIMS (if indicated):    Assets:  Desire for Improvement  ADL's:  Intact  Cognition: WNL  Sleep:  fair    Treatment Plan Summary: Medication management and Plan as follows  1. Major depression moderate;improved. Continue wellbutrin and cymbalta 2. GAD: beter continue cymbalta 3. Relationship : partner unwell due to prostate cancer,still not affectionate.  Recommend therapy to continue to work on coping skills  avoid alcohol 4. Insomnia: avoid xanax, reviewed sleep hygiene and will add trazadone  Fu 6 weeks, renewed wellbutrin  Thresa RossNadeem Majorie Santee, MD 2/1/20199:51 AM

## 2017-06-10 ENCOUNTER — Ambulatory Visit (HOSPITAL_COMMUNITY): Payer: Self-pay | Admitting: Licensed Clinical Social Worker

## 2017-06-10 ENCOUNTER — Ambulatory Visit (INDEPENDENT_AMBULATORY_CARE_PROVIDER_SITE_OTHER): Payer: BLUE CROSS/BLUE SHIELD | Admitting: Licensed Clinical Social Worker

## 2017-06-10 DIAGNOSIS — F331 Major depressive disorder, recurrent, moderate: Secondary | ICD-10-CM

## 2017-06-10 DIAGNOSIS — F411 Generalized anxiety disorder: Secondary | ICD-10-CM | POA: Diagnosis not present

## 2017-06-10 NOTE — Progress Notes (Signed)
THERAPIST PROGRESS NOTE  Session Time: 1:02 PM to 1:58 PM  Participation Level: Active  Behavioral Response: CasualAlertEuthymic  Type of Therapy: Individual Therapy  Treatment Goals addressed:  reduce overall level, frequency and intensity of anxiety so that daily functioning is not impaired, coping  Interventions: Solution Focused, Strength-based, Supportive and Other: Problem solving, coping  Summary: [name] is a 62 y.o. female who presents with describing her symptoms that everything "disturbs her and I need to fix everyone's thing", taking control of other people's things instead of herself. Shares that she worries and doesn't stop even though knows can't control others. Shares some of her worries include grandson who had been doing well but now flunking out of college and relates history of son where there was a lot of chaos and recognizes this may play a part in driving her worry. Worries about paying expenses at house, husband has cancer, worries about everything in the universe. Therapist emphasized the importance of self-care, how activities type to brain and decrease anxiety and depression and are important part of treatment plan. Self-care also helps with stress management and encouraged patient to see this as important or more important than being a caretaker. Relates she used to enjoy baths but now feels the need to hurry up and do the next chore. Shares that there is , always something to do, even when go to sleep at night, has "brain noise" and always has brain noise. Shares she has some anger because they after husband's retirement he developed cancer. Therapist strategize with patient on activities will help with mood and patient is looking into why [employer] where she can exercise and also enjoys being in nature. Episodic encouraged patient with mindset is she can manage her life to find some time to enjoy activities. Patient completed treatment plan in further described  anxiety as that she feels she vibrates from head to toe, wants it to stop but has gotten worse and can't find way out. Shares her insight with processing with therapist that if she doesn't take care of self she will get worse. Describes that she can't grasp anything because mind is always going, doesn't stop, worries about negative outcomes, things can't control but causes her to  Make mistakes, indecisive. Admits to having issues with control. Discussed impact of stress of relationship that husband has always following her asking questions and also not intimate the way they used to be. Discuss activities such as going to the [employer] is helpful to get that will help her de-stress. Completed treatment plan  Suicidal/Homicidal: No  Therapist Response: Assess patient current functioning per report. Discussed recent stressors and coping with stressors. Work with patient on reframing that as a caretaker recognizing self-care is important for her mental health in addition to being able to be effective caretaker. Discussed neuroscience of anxiety and developing habits to help decrease overactive amygdala, help decrease anxiety as a first step in managing anxiety. Reviewed activities that help decrease anxiety to see which ones patient could implement and her schedule. Discussed for example being in nature, exercise, always helpful. Discussed inside of limited control of others to help her with worries as well as taking recognition of patient having capacity to implement activities on her own that will help improve mood and create more positive experiences for herself. Provided strength based and supportive interventions. Completed treatment plan  Plan: Return again in 2-3 weeks.2. Therapist work with patient on strategies that help manage anxiety, that help with coping  Diagnosis:  Axis I:  generalized anxiety disorder, major depressive disorder, recurrent, moderate    Axis II: No diagnosis    Coolidge Breeze,  LCSW 06/10/2017

## 2017-07-04 ENCOUNTER — Other Ambulatory Visit: Payer: Self-pay | Admitting: Sports Medicine

## 2017-07-15 ENCOUNTER — Encounter: Payer: Self-pay | Admitting: Sports Medicine

## 2017-07-15 ENCOUNTER — Ambulatory Visit (HOSPITAL_COMMUNITY): Payer: Self-pay | Admitting: Licensed Clinical Social Worker

## 2017-07-16 ENCOUNTER — Ambulatory Visit (HOSPITAL_COMMUNITY): Payer: Self-pay | Admitting: Psychiatry

## 2017-07-18 ENCOUNTER — Ambulatory Visit: Payer: Self-pay | Admitting: Sports Medicine

## 2017-07-22 ENCOUNTER — Ambulatory Visit (INDEPENDENT_AMBULATORY_CARE_PROVIDER_SITE_OTHER): Payer: BLUE CROSS/BLUE SHIELD | Admitting: Sports Medicine

## 2017-07-22 ENCOUNTER — Encounter: Payer: Self-pay | Admitting: Sports Medicine

## 2017-07-22 VITALS — BP 132/84 | HR 70 | Ht 70.0 in | Wt 200.2 lb

## 2017-07-22 DIAGNOSIS — M797 Fibromyalgia: Secondary | ICD-10-CM

## 2017-07-22 DIAGNOSIS — R140 Abdominal distension (gaseous): Secondary | ICD-10-CM | POA: Diagnosis not present

## 2017-07-22 DIAGNOSIS — M47816 Spondylosis without myelopathy or radiculopathy, lumbar region: Secondary | ICD-10-CM

## 2017-07-22 DIAGNOSIS — M47812 Spondylosis without myelopathy or radiculopathy, cervical region: Secondary | ICD-10-CM

## 2017-07-22 DIAGNOSIS — M79641 Pain in right hand: Secondary | ICD-10-CM

## 2017-07-22 DIAGNOSIS — M7061 Trochanteric bursitis, right hip: Secondary | ICD-10-CM | POA: Diagnosis not present

## 2017-07-22 DIAGNOSIS — M2550 Pain in unspecified joint: Secondary | ICD-10-CM

## 2017-07-22 NOTE — Progress Notes (Signed)
[name]. [name] [phone]  [name] - 62 y.o. female MRN [id number]  Date of birth: 09/20/1955  Visit Date: 07/22/2017  PCP: April Manson, NP   Referred by: April Manson, NP  Scribe for today's visit: Stevenson Clinch, CMA     SUBJECTIVE:  [name] is here for Follow-up (back spasms)  04/07/2017: Compared to the last office visit, her previously described symptoms have varied in severity. At times her joint and pain will be improved and other times the pain will be a lot worse. She has been experiencing extremely painful cramps in her legs and feet at night.  Current symptoms vary from mild at times to severe at other times to the point where she doesn't even want anyone to touch her.  She had an injection in her hip by Dr. Magnus Ivan and she did get short term relief with this. She was given home exercises which she has been doing when the pain isn't severe.   07/22/2017: L-side lower back pain started flaring up about 2 weeks ago and has been intermittent. The pain radiates into the mid-thoracic spine. She denies groin pain. Pain is severe at times, described as throbbing and aching at rest but sharp with movement, like "scissors sharp" or "a shot of electricity".  She has noticed change with bowel movements, only have 1 BM every 3-4 days.  Pain is keeping her up at night.  She has been alternating heat and ice, heat seems to help more. She has been taking IBU with some relief. She has also tried taking a muscle relaxer with minimal relief.    04/21/2017 MRI L-spine  ROS Reports night time disturbances. Denies fevers, chills, or night sweats. Denies unexplained weight loss. Denies personal history of cancer. Reports changes in bowel habits, 1 BM every 3-4 days. Denies recent unreported falls. Denies new or worsening dyspnea or wheezing. Reports headaches or dizziness.  Denies numbness,  tingling or weakness  In the extremities.  Denies dizziness or presyncopal episodes Denies lower extremity edema    HISTORY & PERTINENT PRIOR DATA:  Prior History reviewed and updated per electronic medical record.  Significant/pertinent history, findings, studies include:  reports that she quit smoking about 15 months ago. Her smoking use included cigarettes. She has never used smokeless tobacco. Recent Labs    07/30/16 1012 09/18/16 0830  HGBA1C  --  6.4*  LABURIC 4.6  --    No specialty comments available. No problems updated.  OBJECTIVE:  VS:  HT:5\' 10"  (177.8 cm)   WT:200 lb 3.2 oz (90.8 kg)  BMI:28.73    BP:132/84  HR:70bpm  TEMP: ( )  RESP:95 %   PHYSICAL EXAM: Constitutional: WDWN, Non-toxic appearing. Psychiatric: Alert & appropriately interactive.  Not depressed or anxious appearing. Respiratory: No increased work of breathing.  Trachea Midline Eyes: Pupils are equal.  EOM intact without nystagmus.  No scleral icterus  Vascular Exam: warm to touch no edema  lower extremity neuro exam: unremarkable normal strength normal sensation normal reflexes  MSK Exam: Mid thoracic spine is unremarkable.  She has some abdominal distention but is soft and nontender to palpation.  She has no peritoneal signs.   ASSESSMENT & PLAN:   1. Abdominal distension (gaseous)     PLAN:>50% of this 25 minute visit spent in direct patient counseling and/or coordination of care.  Discussion was focused on education regarding the in discussing the pathoetiology and  anticipated clinical course of the above condition.  Ultimately patient has multiple comorbidities and is complaining of multiple polyarthralgic complaints as well as visceral complaints.  Further diagnostic evaluation of her abdomen indicated's time given ongoing symptoms and so also gives a clear picture of her hips.  We will plan to have her follow-up by telephone once the results are obtained and will likely recommend  return to her PCP and depending on results of her hip could consider further intervention with physiatry versus orthopedics.  Follow-up: Return for we will call you about your results.      Please see additional documentation for Objective, Assessment and Plan sections. Pertinent additional documentation may be included in corresponding procedure notes, imaging studies, problem based documentation and patient instructions. Please see these sections of the encounter for additional information regarding this visit.  CMA/ATC served as Neurosurgeonscribe during this visit. History, Physical, and Plan performed by medical provider. Documentation and orders reviewed and attested to.      Andrena MewsMichael D Charlita Brian, DO    [place] [facility]

## 2017-07-22 NOTE — Patient Instructions (Signed)
We are ordering an CT for you today.  The imaging office will be calling you to schedule your appointment after we obtain authorization from your insurance company.   Please be sure you have signed up for MyChart so that we can get your results to you.  We will be in touch with you as soon as we can.  Please know, it can take up to 3-4 business days for the radiologist and Dr. Rigby to have time to review the results and determine the best appropriate action.  If there is something that appears to be surgical or needs a referral to other specialists we will let you know through MyChart or telephone.  Otherwise we will plan to schedule a follow up appointment with Dr. Rigby once we have the results.   

## 2017-07-23 ENCOUNTER — Other Ambulatory Visit: Payer: Self-pay

## 2017-07-23 ENCOUNTER — Other Ambulatory Visit (HOSPITAL_COMMUNITY): Payer: Self-pay | Admitting: Psychiatry

## 2017-07-23 ENCOUNTER — Telehealth (HOSPITAL_COMMUNITY): Payer: Self-pay | Admitting: Family Medicine

## 2017-07-23 DIAGNOSIS — M797 Fibromyalgia: Secondary | ICD-10-CM

## 2017-07-23 DIAGNOSIS — M47816 Spondylosis without myelopathy or radiculopathy, lumbar region: Secondary | ICD-10-CM

## 2017-07-23 DIAGNOSIS — M160 Bilateral primary osteoarthritis of hip: Secondary | ICD-10-CM

## 2017-07-23 DIAGNOSIS — M2550 Pain in unspecified joint: Secondary | ICD-10-CM

## 2017-07-23 MED ORDER — TRAMADOL HCL 50 MG PO TABS: 50.0000 mg | ORAL_TABLET | Freq: Three times a day (TID) | ORAL | 0 refills | Status: DC | PRN

## 2017-07-23 NOTE — Telephone Encounter (Addendum)
Copied from CRM [id number]. Topic: Quick Communication - See Telephone Encounter >> Jul 23, 2017 11:11 AM Trula SladeWalter, Linda F wrote: CRM for notification. See Telephone encounter for: 07/23/17. Patient said she is in need of pain medication, and if Dr. Berline Choughigby is not comfortable giving her anything, she needs a referral to a pain management doctor.  The pain is causing other issues with her body.  So patient said she needs an answer today.

## 2017-07-23 NOTE — Telephone Encounter (Signed)
Copied from CRM [id number]. Topic: Quick Communication - See Telephone Encounter >> Jul 23, 2017 11:11 AM Trula SladeWalter, Linda F wrote: CRM for notification. See Telephone encounter for: 07/23/17.

## 2017-07-23 NOTE — Telephone Encounter (Signed)
Per Dr. Berline Choughigby, OK to refill Tramadol #60/0 refills and refer to pain management. Called and spoke with patient and advised. Pt verbalized understanding that this is a short term prescription and Dr. Berline Choughigby does not do chronic pain management. Rx called in, referral placed to pain management.

## 2017-07-23 NOTE — Telephone Encounter (Signed)
Forwarding to Dr. Rigby to advise.  

## 2017-07-25 ENCOUNTER — Encounter: Payer: Self-pay | Admitting: Sports Medicine

## 2017-07-25 ENCOUNTER — Telehealth: Payer: Self-pay

## 2017-07-25 NOTE — Telephone Encounter (Signed)
Spoke with patient, [facility] will [facility]. She will drop by information for [employer] patient assistance and we will fax of information for her.

## 2017-07-25 NOTE — Telephone Encounter (Signed)
Copied from CRM [phone]. Topic: Quick Communication - See Telephone Encounter >> Jul 25, 2017 10:28 AM Landry MellowFoltz, Melissa J wrote: CRM for notification. See Telephone encounter for: 07/25/17.  Pt is calling to find out about status of ppw for lyrica discount program.   Pt is also calling to find out if she needs to have contrast for her ct and if she needs to pick that up Please call back at [phone]

## 2017-07-28 ENCOUNTER — Other Ambulatory Visit: Payer: Self-pay

## 2017-07-28 MED ORDER — PREGABALIN 75 MG PO CAPS: 75.0000 mg | ORAL_CAPSULE | Freq: Two times a day (BID) | ORAL | 3 refills | Status: DC

## 2017-07-29 ENCOUNTER — Ambulatory Visit
Admission: RE | Admit: 2017-07-29 | Discharge: 2017-07-29 | Disposition: A | Discharge status: Home / Self Care | Payer: BLUE CROSS/BLUE SHIELD | Source: Ambulatory Visit | Attending: Sports Medicine | Admitting: Sports Medicine

## 2017-07-29 DIAGNOSIS — R140 Abdominal distension (gaseous): Secondary | ICD-10-CM

## 2017-07-29 MED ORDER — IOPAMIDOL (ISOVUE-300) INJECTION 61%
100.0000 mL | Freq: Once | INTRAVENOUS | Status: AC | PRN
  Administered 2017-07-29: 100 mL via INTRAVENOUS

## 2017-07-31 ENCOUNTER — Encounter: Payer: Self-pay | Admitting: Sports Medicine

## 2017-07-31 ENCOUNTER — Telehealth: Payer: Self-pay | Admitting: Sports Medicine

## 2017-07-31 NOTE — Telephone Encounter (Signed)
Copied from CRM [phone]. Topic: Quick Communication - Other Results >> Jul 31, 2017  2:09 PM Percival SpanishKennedy, Cheryl W wrote:  Pt would like a call back about her CT scans that Dr Berline Choughigby ordered    [phone]

## 2017-08-01 NOTE — Telephone Encounter (Signed)
Forwarding to Dr. Rigby to advise.  

## 2017-08-04 ENCOUNTER — Encounter (INDEPENDENT_AMBULATORY_CARE_PROVIDER_SITE_OTHER): Payer: Self-pay | Admitting: Physical Medicine and Rehabilitation

## 2017-08-05 ENCOUNTER — Encounter: Payer: Self-pay | Admitting: Sports Medicine

## 2017-08-06 ENCOUNTER — Telehealth: Payer: Self-pay | Admitting: Sports Medicine

## 2017-08-06 NOTE — Telephone Encounter (Signed)
Copied from CRM [phone]. Topic: Quick Communication - Lab Results >> Aug 06, 2017  1:24 PM Dierdre Searlesoleman, Brandy S, [place] wrote: Called patient to inform them of 07/29/2017 imaging results. When patient returns call, triage nurse may disclose results.  >> Aug 06, 2017  1:25 PM Dierdre Searlesoleman, Brandy S, CMA wrote: Notes recorded by Andrena Mewsigby, Michael D, DO on 08/06/2017 at 1:19 PM EDT No abdominal findings appreciate. If still having issues recommend f/u with PCP. She does have arthritis and AVN in her left hip that could be causing some groin pain.  Called pt and left message with husband for pt to call the office.    Pt returning call to Nurse Triage. CB# [phone].

## 2017-08-06 NOTE — Progress Notes (Signed)
No abdominal findings appreciate.  If still having issues recommend f/u with PCP.  She does have arthritis and AVN in her left hip that could be causing some groin pain.

## 2017-08-06 NOTE — Telephone Encounter (Signed)
Reviewed CT results and physician's note with patient. She asked for Dr. Berline Choughigby to recommend a treatment for her pain based on the CT results and his exam. She would like see Dr. Alvester MorinNewton for a possible pain inject, as he treated her in January for similar pain and it worked for a lengthy time. Please advise patient via MyChart.

## 2017-08-06 NOTE — Telephone Encounter (Signed)
Left VM to return call 

## 2017-08-07 NOTE — Telephone Encounter (Signed)
Replied via MyChart per pt request.

## 2017-08-12 ENCOUNTER — Encounter (INDEPENDENT_AMBULATORY_CARE_PROVIDER_SITE_OTHER): Payer: Self-pay | Admitting: Physical Medicine and Rehabilitation

## 2017-08-13 NOTE — Telephone Encounter (Signed)
See telephone note 08/06/17 and patient e-mail 08/07/17. Pt will f/u with Dr. Alvester MorinNewton.

## 2017-08-13 NOTE — Telephone Encounter (Signed)
Was this ever addressed with her? I believe her stomach issues need to be addressed by her PCP. THERE WAS NO ACUTE PROCESS on CT. The E ARTHRITIS IN HER BACK AND LEFT HIP IS PROBABLY CAUSING SOME back and leg pain but not the rest.

## 2017-08-14 ENCOUNTER — Encounter (INDEPENDENT_AMBULATORY_CARE_PROVIDER_SITE_OTHER): Payer: Self-pay | Admitting: Physical Medicine and Rehabilitation

## 2017-08-21 ENCOUNTER — Encounter

## 2017-08-21 ENCOUNTER — Ambulatory Visit (INDEPENDENT_AMBULATORY_CARE_PROVIDER_SITE_OTHER): Payer: BLUE CROSS/BLUE SHIELD | Admitting: Physical Medicine and Rehabilitation

## 2017-08-21 ENCOUNTER — Encounter (INDEPENDENT_AMBULATORY_CARE_PROVIDER_SITE_OTHER): Payer: Self-pay | Admitting: Physical Medicine and Rehabilitation

## 2017-08-21 ENCOUNTER — Ambulatory Visit (INDEPENDENT_AMBULATORY_CARE_PROVIDER_SITE_OTHER): Payer: BLUE CROSS/BLUE SHIELD

## 2017-08-21 VITALS — BP 117/75 | HR 71 | Temp 97.7°F

## 2017-08-21 DIAGNOSIS — M47816 Spondylosis without myelopathy or radiculopathy, lumbar region: Secondary | ICD-10-CM | POA: Diagnosis not present

## 2017-08-21 DIAGNOSIS — G5601 Carpal tunnel syndrome, right upper limb: Secondary | ICD-10-CM

## 2017-08-21 DIAGNOSIS — M542 Cervicalgia: Secondary | ICD-10-CM | POA: Diagnosis not present

## 2017-08-21 DIAGNOSIS — M797 Fibromyalgia: Secondary | ICD-10-CM

## 2017-08-21 DIAGNOSIS — M47812 Spondylosis without myelopathy or radiculopathy, cervical region: Secondary | ICD-10-CM

## 2017-08-21 DIAGNOSIS — G8929 Other chronic pain: Secondary | ICD-10-CM

## 2017-08-21 DIAGNOSIS — M545 Low back pain: Secondary | ICD-10-CM

## 2017-08-21 MED ORDER — METHYLPREDNISOLONE ACETATE 80 MG/ML IJ SUSP: 80.0000 mg | Freq: Once | INTRAMUSCULAR | Status: AC

## 2017-08-21 NOTE — Progress Notes (Signed)
  Numeric Pain Rating Scale and Functional Assessment Average Pain 5   In the last MONTH (on 0-10 scale) has pain interfered with the following?  1. General activity like being  able to carry out your everyday physical activities such as walking, climbing stairs, carrying groceries, or moving a chair?  Rating(7)   +Driver, -BT, -Dye Allergies. 

## 2017-08-21 NOTE — Progress Notes (Signed)
OMEKA HOLBEN - 62 y.o. female MRN [id number]  Date of birth: 1955/05/05  Office Visit Note: Visit Date: 08/21/2017 PCP: April Manson, NP Referred by: April Manson, NP  Subjective: Chief Complaint  Patient presents with  . Lower Back - Pain   HPI: Mrs. Dwyer is a 62 year old right-hand-dominant female who comes in today with her husband who provides some of the history.  She is followed by Dr. Gaspar Bidding who has been managing her low back and hip and leg pain and through him she has obtained MRI of the lumbar spine and has had directed therapy and medication management.  She comes in today with worsening low back pain bilaterally.  She reports years of pain.  Her case is complicated by fibromyalgia which in most people with fibromyalgia it seems to be an underlying issue that there are multiple pain complaints and worsening in areas that makes sense from a pathology standpoint but always seem to be somewhat out of proportion.  This is not necessarily with this particular patient but with all patients with fibromyalgia at least in my clinical practice.  She reports that the last injection which was a bilateral L5-S1 block gave her great relief for quite a while.  Diagnostically it did relieve a lot of her back pain.  She reports that heating activity makes it worse and she does use some heat and ice and medications for relief.  She rates her average pain is a 5 out of 10 but it does affect her daily activities.  She does like to work in the yard and do things and this seems to exacerbate her pain quite a bit.  She has had physical therapy.  She continues with exercises.  She has no real radicular complaints although again there are somatic complaints in the hips and legs at times.  She has had a prior right total hip arthroplasty last year by Dr. Magnus Ivan in our office.  She said no focal weakness or trauma.  No other red flag complaints.  As a secondary problem she has had neck pain with  referral pain in the shoulder on the right particularly but across the upper back and then she has had pain and tingling into the right hand.  She has had situations where she says her hand really does not want to behave with the signal that she sends it.  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  Dr. Berline Chough has addressed this as well.  She did have cervical injection which did seem to help her neck pain.  A lot of the pain is also myofascial.  MRI of the cervical spine showed some foraminal narrowing left more than right but no central canal stenosis or focal disc herniations or extrusions.   Review of Systems  Constitutional: Negative for chills, fever, malaise/fatigue and weight loss.  HENT: Negative for hearing loss and sinus pain.   Eyes: Negative for blurred vision, double vision and photophobia.  Respiratory: Negative for cough and shortness of breath.   Cardiovascular: Negative for chest pain, palpitations and leg swelling.  Gastrointestinal: Negative for abdominal pain, nausea and vomiting.  Genitourinary: Negative for flank pain.  Musculoskeletal: Positive for back pain, joint pain and neck pain. Negative for myalgias.  Skin: Negative for itching and rash.  Neurological: Positive for tingling. Negative for tremors, focal weakness and weakness.  Endo/Heme/Allergies: Negative.   Psychiatric/Behavioral: Negative for  depression.  All other systems reviewed and are negative.  Otherwise per HPI.  Assessment & Plan: Visit Diagnoses:  1. Spondylosis without myelopathy or radiculopathy, lumbar region   2. Chronic bilateral low back pain without sciatica   3. Cervicalgia   4. Cervical spondylosis without myelopathy   5. Fibromyalgia   6. Carpal tunnel syndrome, right upper limb     Plan: Findings:  1.  Chronic worsening axial low back pain predominantly in the setting of significant facet arthropathy at  L5-S1 with underlying fibromyalgia and status post right total hip arthroplasty last year.  Most of her pain does seem to be facet mediated pain with worsening with extension of the lumbar spine and standing.  She does get some other somatic complaints in various areas in the hips and legs.  I think the underlying fibromyalgia is responsible for some of this but clearly she has pretty significant arthritis of the lumbar spine.  There is no focal nerve compression or central stenosis or listhesis.  She does have small disc protrusion centrally.  We did go over the MRI with her at length again today.  Our plan today because of the severity of her symptoms is bilateral L5-S1 blocks.  Depending on the results would look at radiofrequency ablation of the lower facets.  She really meets all the criteria for this.  2.  Cervicalgia and cervical spondylosis with foraminal stenosis.  She continues to have chronic neck pain and upper back pain and right more than left shoulder pain.  Some of this could be radicular in nature and she did get some relief with cervical epidural injection.  Depending on her symptoms we could repeat this at some point.  Her biggest complaint at times is been the right hand as well with what sounds like spasming and numbness and tingling.  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  I think definitive treatment here would probably surgical release of the carpal tunnel.  She is seeing Dr. Magnus Ivan in the past for her hip and he does do the carpal tunnel releases.  She will talk to Dr. Berline Chough about recommendations for the carpal tunnel.    Meds & Orders:  Meds ordered this encounter  Medications  . methylPREDNISolone acetate (DEPO-MEDROL) injection 80 mg    Orders Placed This Encounter  Procedures  . Facet Injection  . XR C-ARM NO REPORT    Follow-up: Return if symptoms worsen or fail to improve, for Dr. Berline Chough.    Procedures: No procedures performed  Lumbar Facet Joint Intra-Articular Injection(s) with Fluoroscopic Guidance  Patient: [name]      Date of Birth: 1955/06/24 MRN: [id number] PCP: April Manson, NP      Visit Date: 08/21/2017   Universal Protocol:    Date/Time: 08/21/2017  Consent Given By: the patient  Position: PRONE   Additional Comments: Vital signs were monitored before and after the procedure. Patient was prepped and draped in the usual sterile fashion. The correct patient, procedure, and site was verified.   Injection Procedure Details:  Procedure Site One Meds Administered:  Meds ordered this encounter  Medications  . methylPREDNISolone acetate (DEPO-MEDROL) injection 80 mg     Laterality: Bilateral  Location/Site:  L5-S1  Needle size: 22 guage  Needle type: Spinal  Needle Placement: Articular  Findings:  -Comments: Excellent flow of contrast producing a partial arthrogram.  Procedure Details: The fluoroscope beam is vertically oriented in  AP, and the inferior recess is visualized beneath the lower pole of the inferior apophyseal process, which represents the target point for needle insertion. When direct visualization is difficult the target point is located at the medial projection of the vertebral pedicle. The region overlying each aforementioned target is locally anesthetized with a 1 to 2 ml. volume of 1% Lidocaine without Epinephrine.   The spinal needle was inserted into each of the above mentioned facet joints using biplanar fluoroscopic guidance. A 0.25 to 0.5 ml. volume of Isovue-250 was injected and a partial facet joint arthrogram was obtained. A single spot film was obtained of the resulting arthrogram.    One to 1.25 ml of the steroid/anesthetic solution was then injected into each of the facet joints noted above.   Additional Comments:  The patient tolerated the procedure well Dressing: Band-Aid    Post-procedure  details: Patient was observed during the procedure. Post-procedure instructions were reviewed.  Patient left the clinic in stable condition.    Clinical History: MRI LUMBAR SPINE WITHOUT CONTRAST  TECHNIQUE: Multiplanar, multisequence MR imaging of the lumbar spine was performed. No intravenous contrast was administered.  COMPARISON:  Lumbar spine radiographs 01/20/2017  FINDINGS: Segmentation: 5 lumbar type vertebral bodies. The last full intervertebral disc space is labeled L5-S1. This correlates with the radiographs.  Alignment:  Normal  Vertebrae:  No bone lesions or fracture.  Conus medullaris and cauda equina: Conus extends to the T12-L1 level. Conus and cauda equina appear normal.  Paraspinal and other soft tissues: No significant findings  Disc levels:  T12-L1:  No significant findings.  L1-2: Mild facet disease but no disc protrusions, spinal or foraminal stenosis.  L2-3:  No significant findings.  L3-4: Mild diffuse annular bulge with slight flattening of the ventral thecal sac and mild bilateral lateral recess encroachment. No spinal or foraminal stenosis. Mild to moderate facet disease.  L4-5: Diffuse annular bulge and shallow central disc protrusion with mild impression on the thecal sac. There is mild bilateral lateral recess encroachment but no significant spinal or foraminal stenosis. Mild to moderate facet disease.  L5-S1: Advanced facet disease but no disc protrusions, spinal or foraminal stenosis.  IMPRESSION: 1. Mild bilateral lateral recess encroachment at L3-4 and L4-5. No spinal or foraminal stenosis. 2. Advanced facet disease at L5-S1 but no spinal or foraminal stenosis.   Electronically Signed   By: Rudie Meyer M.D.   On: 04/21/2017 13:30  MRI CERVICAL SPINE WITHOUT CONTRAST  TECHNIQUE: Multiplanar, multisequence MR imaging of the cervical spine was performed. No intravenous contrast was  administered.  COMPARISON: None.  FINDINGS: Alignment: Physiologic.  Vertebrae: No fracture, evidence of discitis, or bone lesion.  Cord: Normal signal and morphology.  Posterior Fossa, vertebral arteries, paraspinal tissues: Incidental vessels versus subcentimeter cystic spaces in the right parotid tail.  Disc levels:  C2-3: Asymmetric left uncovertebral spurring with mild left foraminal stenosis. Patent spinal canal  C3-4: Disc narrowing with endplate and uncovertebral ridging. Asymmetric left facet spurring, moderate. Partial ventral subarachnoid space effacement. No cord compression. Mild left foraminal stenosis.  C4-5: Degenerative disc narrowing with asymmetric left uncovertebral ridging. Mild facet spurring. Advanced left and moderate right foraminal stenosis. Mild spinal stenosis, there is ventral subarachnoid space effacement without cord impingement  C5-6: Degenerative disc narrowing with endplate and left more than right uncovertebral ridging. Negative facets. Advanced left and moderate right foraminal stenosis. Ventral subarachnoid space effacement.  C6-7: Small central disc protrusion. Negative facets. Patent canal and foramina  C7-T1:Unremarkable.  IMPRESSION: 1.  C4-5 and C5-6 advanced left and moderate right foraminal stenosis primarily from uncovertebral spurring. 2. Disc osteophyte complexes cause mild spinal stenosis from C3-4 to C5-6. 3. C2-3 and C3-4 mild left foraminal narrowing from uncovertebral spurs.   Electronically Signed By: Marnee Spring M.D. On: 11/23/2016 10:24   She reports that she quit smoking about 16 months ago. Her smoking use included cigarettes. She has never used smokeless tobacco.  Recent Labs    09/18/16 0830  HGBA1C 6.4*    Objective:  VS:  HT:    WT:   BMI:     BP:117/75  HR:71bpm  TEMP:97.7 F (36.5 C)(Oral)  RESP:94 % Physical Exam  Constitutional: She is oriented to person, place, and  time. She appears well-developed and well-nourished. No distress.  HENT:  Head: Normocephalic and atraumatic.  Nose: Nose normal.  Mouth/Throat: Oropharynx is clear and moist.  Eyes: Pupils are equal, round, and reactive to light. Conjunctivae are normal.  Neck: Normal range of motion. Neck supple.  Cardiovascular: Regular rhythm and intact distal pulses.  Pulmonary/Chest: Effort normal. No respiratory distress.  Abdominal: She exhibits no distension. There is no guarding.  Musculoskeletal:  Patient sits with forward flexed cervical spine.  She does have myofascial pain with trigger points and tender points in the upper trapezius and paraspinal muscles.  She has some stiffness with range of motion.  She goes from sit to stand with some difficulty at the end range of motion with extension.  Extension rotation and facet loading do reproduce some of her back pain.  This is concordant.  She has good distal strength.  Neurological: She is alert and oriented to person, place, and time. She exhibits normal muscle tone. Coordination normal.  Skin: Skin is warm. No rash noted. No erythema.  Psychiatric: She has a normal mood and affect. Her behavior is normal.  Nursing note and vitals reviewed.   Ortho Exam Imaging: No results found.  Past Medical/Family/Surgical/Social History: Medications & Allergies reviewed per EMR, new medications updated. Patient Active Problem List   Diagnosis Date Noted  . Trochanteric bursitis of right hip 04/02/2017  . Fibromyalgia 01/22/2017  . Carpal tunnel syndrome 12/15/2016  . Status post total replacement of right hip 09/27/2016  . Polyarthralgia 07/30/2016  . Chronic right shoulder pain 07/30/2016  . Right hand pain 07/30/2016  . Facet arthritis of lumbar region 05/20/2016  . Facet arthritis of cervical region 05/20/2016  . Primary osteoarthritis of both hips 12/18/2015  . Chronic bronchitis (HCC) 09/25/2015  . Essential hypertension 09/25/2015  . GAD  (generalized anxiety disorder) 09/25/2015  . Gastroesophageal reflux disease without esophagitis 09/25/2015  . Hyperlipemia 09/25/2015  . Diabetes mellitus with complication (HCC) 09/25/2015   Past Medical History:  Diagnosis Date  . Anxiety and depression   . Asthma    related to seasonal allergies  . Avascular necrosis of hip, right (HCC) 09/27/2016  . AVN of femur (HCC) 07/30/2016  . Diabetes mellitus with complication (HCC)    microalbuminuria 03/2015  . GERD (gastroesophageal reflux disease)   . Hyperlipidemia, mixed 07/2015  . Hypertension   . Vitamin D deficiency    History reviewed. No pertinent family history. Past Surgical History:  Procedure Laterality Date  . FOOT SURGERY    . INCONTINENCE SURGERY    . SHOULDER SURGERY    . TONSILLECTOMY    . TOTAL HIP ARTHROPLASTY Right 09/27/2016   Procedure: RIGHT TOTAL HIP ARTHROPLASTY ANTERIOR APPROACH;  Surgeon: Kathryne Hitch, MD;  Location: [facility];  Service: Orthopedics;  Laterality: Right;   Social History   Occupational History  . Not on file  Tobacco Use  . Smoking status: Former Smoker    Types: Cigarettes    Last attempt to quit: 04/15/2016    Years since quitting: 1.3  . Smokeless tobacco: Never Used  Substance and Sexual Activity  . Alcohol use: Yes    Alcohol/week: 1.2 - 1.8 oz    Types: 2 - 3 Glasses of wine per week    Comment: occasional  . Drug use: No  . Sexual activity: Never

## 2017-08-21 NOTE — Patient Instructions (Signed)

## 2017-08-28 ENCOUNTER — Encounter: Payer: Self-pay | Admitting: Sports Medicine

## 2017-09-01 ENCOUNTER — Encounter (INDEPENDENT_AMBULATORY_CARE_PROVIDER_SITE_OTHER): Payer: BLUE CROSS/BLUE SHIELD | Admitting: Physical Medicine and Rehabilitation

## 2017-09-04 NOTE — Procedures (Signed)
Lumbar Facet Joint Intra-Articular Injection(s) with Fluoroscopic Guidance  Patient: [name]      Date of Birth: 1956-04-14 MRN: [id number] PCP: April Manson, NP      Visit Date: 08/21/2017   Universal Protocol:    Date/Time: 08/21/2017  Consent Given By: the patient  Position: PRONE   Additional Comments: Vital signs were monitored before and after the procedure. Patient was prepped and draped in the usual sterile fashion. The correct patient, procedure, and site was verified.   Injection Procedure Details:  Procedure Site One Meds Administered:  Meds ordered this encounter  Medications  . methylPREDNISolone acetate (DEPO-MEDROL) injection 80 mg     Laterality: Bilateral  Location/Site:  L5-S1  Needle size: 22 guage  Needle type: Spinal  Needle Placement: Articular  Findings:  -Comments: Excellent flow of contrast producing a partial arthrogram.  Procedure Details: The fluoroscope beam is vertically oriented in AP, and the inferior recess is visualized beneath the lower pole of the inferior apophyseal process, which represents the target point for needle insertion. When direct visualization is difficult the target point is located at the medial projection of the vertebral pedicle. The region overlying each aforementioned target is locally anesthetized with a 1 to 2 ml. volume of 1% Lidocaine without Epinephrine.   The spinal needle was inserted into each of the above mentioned facet joints using biplanar fluoroscopic guidance. A 0.25 to 0.5 ml. volume of Isovue-250 was injected and a partial facet joint arthrogram was obtained. A single spot film was obtained of the resulting arthrogram.    One to 1.25 ml of the steroid/anesthetic solution was then injected into each of the facet joints noted above.   Additional Comments:  The patient tolerated the procedure well Dressing: Band-Aid    Post-procedure details: Patient was observed during the  procedure. Post-procedure instructions were reviewed.  Patient left the clinic in stable condition.

## 2017-09-05 ENCOUNTER — Other Ambulatory Visit: Payer: Self-pay

## 2017-09-05 ENCOUNTER — Other Ambulatory Visit: Payer: Self-pay | Admitting: Sports Medicine

## 2017-09-05 MED ORDER — DULOXETINE HCL 30 MG PO CPEP: 30.0000 mg | ORAL_CAPSULE | Freq: Two times a day (BID) | ORAL | 2 refills | Status: DC

## 2017-09-05 NOTE — Telephone Encounter (Signed)
There was an error with sending, this is for Dr. Janeece Riggers team not Dr. Durene Cal. Thank you

## 2017-09-05 NOTE — Telephone Encounter (Signed)
See note

## 2017-09-05 NOTE — Telephone Encounter (Signed)
Rx refill request: Cymbalta 30 mg    Last filled: 05/07/17  #60  LOV: 07/22/17  PCP: [name] ( Rx prescriber)  Pharmacy: verified

## 2017-09-05 NOTE — Telephone Encounter (Signed)
Copied from CRM [id number]. Topic: Quick Communication - Rx Refill/Question >> Sep 05, 2017 10:26 AM Marylen Ponto wrote: Medication: DULoxetine (CYMBALTA) 30 MG capsule   Preferred Pharmacy (with phone number or street name): [facility] - [place], [place] - [address] [phone] (Phone) [phone] (Fax)  Agent: Please be advised that RX refills may take up to 3 business days. We ask that you follow-up with your pharmacy.

## 2017-09-09 ENCOUNTER — Encounter: Payer: Self-pay | Admitting: Sports Medicine

## 2017-09-09 ENCOUNTER — Encounter (INDEPENDENT_AMBULATORY_CARE_PROVIDER_SITE_OTHER): Payer: Self-pay | Admitting: Physical Medicine and Rehabilitation

## 2017-09-18 ENCOUNTER — Other Ambulatory Visit: Payer: Self-pay

## 2017-09-18 ENCOUNTER — Encounter: Payer: Self-pay | Admitting: Sports Medicine

## 2017-09-18 DIAGNOSIS — G8929 Other chronic pain: Secondary | ICD-10-CM

## 2017-09-18 DIAGNOSIS — Z96641 Presence of right artificial hip joint: Secondary | ICD-10-CM

## 2017-09-18 DIAGNOSIS — M25511 Pain in right shoulder: Secondary | ICD-10-CM

## 2017-09-18 DIAGNOSIS — M2550 Pain in unspecified joint: Secondary | ICD-10-CM

## 2017-09-18 DIAGNOSIS — M47812 Spondylosis without myelopathy or radiculopathy, cervical region: Secondary | ICD-10-CM

## 2017-09-18 DIAGNOSIS — M160 Bilateral primary osteoarthritis of hip: Secondary | ICD-10-CM

## 2017-09-18 DIAGNOSIS — M47816 Spondylosis without myelopathy or radiculopathy, lumbar region: Secondary | ICD-10-CM

## 2017-09-18 DIAGNOSIS — M797 Fibromyalgia: Secondary | ICD-10-CM

## 2017-09-18 DIAGNOSIS — M7061 Trochanteric bursitis, right hip: Secondary | ICD-10-CM

## 2017-09-18 NOTE — Telephone Encounter (Signed)
I agree an Rx to [facility] on [name] (as recommended by Dr. Alvester Morin) is likely the most appropriate.  Can you please make this happen and let her know

## 2017-10-01 ENCOUNTER — Encounter (INDEPENDENT_AMBULATORY_CARE_PROVIDER_SITE_OTHER): Payer: Self-pay | Admitting: Physician Assistant

## 2017-10-01 ENCOUNTER — Ambulatory Visit (INDEPENDENT_AMBULATORY_CARE_PROVIDER_SITE_OTHER): Payer: BLUE CROSS/BLUE SHIELD | Admitting: Orthopaedic Surgery

## 2017-10-01 ENCOUNTER — Ambulatory Visit (INDEPENDENT_AMBULATORY_CARE_PROVIDER_SITE_OTHER): Payer: BLUE CROSS/BLUE SHIELD | Admitting: Physician Assistant

## 2017-10-01 ENCOUNTER — Ambulatory Visit (INDEPENDENT_AMBULATORY_CARE_PROVIDER_SITE_OTHER): Payer: BLUE CROSS/BLUE SHIELD

## 2017-10-01 DIAGNOSIS — Z96641 Presence of right artificial hip joint: Secondary | ICD-10-CM | POA: Diagnosis not present

## 2017-10-01 NOTE — Progress Notes (Signed)
Office Visit Note   Patient: [name]           Date of Birth: [birth year]           MRN: [id number] Visit Date: 10/01/2017              Requested by: April MansonWhite, Marsha L, NP [address] [place], [place] [id number] PCP: April MansonWhite, Marsha L, NP   Assessment & Plan: Visit Diagnoses:  1. History of right hip replacement     Plan: She is activities as tolerated.  She will follow-up with us on an as-needed basis.  Questions were encouraged and answered.  Follow-Up Instructions: Return if symptoms worsen or fail to improve.   Orders:  Orders Placed This Encounter  Procedures  . XR HIP UNILAT W OR W/O PELVIS 1V RIGHT   No orders of the defined types were placed in this encounter.     Procedures: No procedures performed   Clinical Data: No additional findings.   Subjective: Chief Complaint  Patient presents with  . Right Hip - Follow-up    HPI [name] comes in today 1 year status post right total hip arthroplasty.  She states the right hip is doing great.  She has no complaints.  She does have some numbness about the incision. Review of Systems No fevers chills shortness of breath chest pain  Objective: Vital Signs: There were no vitals taken for this visit.  Physical Exam  Constitutional: She is oriented to person, place, and time. She appears well-developed and well-nourished. No distress.  Pulmonary/Chest: Effort normal.  Neurological: She is alert and oriented to person, place, and time.  Skin: She is not diaphoretic.  Psychiatric: She has a normal mood and affect.    Ortho Exam Right hip good range of motion without pain.  She ambulates without any assistive device.  Nonantalgic gait.  Surgical incisions well-healed no signs of infection.  Dorsiflexion plantarflexion right ankle intact. Specialty Comments:  No specialty comments available.  Imaging: Xr Hip Unilat W Or W/o Pelvis 1v Right  Result Date: 10/01/2017 AP pelvis lateral view of the  right hip: Bilateral hips well located.  Right total hip arthroplasty components are well-seated.  No acute fractures or bony abnormalities.  Left hip is well-preserved.    PMFS History: Patient Active Problem List   Diagnosis Date Noted  . Trochanteric bursitis of right hip 04/02/2017  . Fibromyalgia 01/22/2017  . Carpal tunnel syndrome 12/15/2016  . Status post total replacement of right hip 09/27/2016  . Polyarthralgia 07/30/2016  . Chronic right shoulder pain 07/30/2016  . Right hand pain 07/30/2016  . Facet arthritis of lumbar region 05/20/2016  . Facet arthritis of cervical region 05/20/2016  . Primary osteoarthritis of both hips 12/18/2015  . Chronic bronchitis (HCC) 09/25/2015  . Essential hypertension 09/25/2015  . GAD (generalized anxiety disorder) 09/25/2015  . Gastroesophageal reflux disease without esophagitis 09/25/2015  . Hyperlipemia 09/25/2015  . Diabetes mellitus with complication (HCC) 09/25/2015   Past Medical History:  Diagnosis Date  . Anxiety and depression   . Asthma    related to seasonal allergies  . Avascular necrosis of hip, right (HCC) 09/27/2016  . AVN of femur (HCC) 07/30/2016  . Diabetes mellitus with complication (HCC)    microalbuminuria 03/2015  . GERD (gastroesophageal reflux disease)   . Hyperlipidemia, mixed 07/2015  . Hypertension   . Vitamin D deficiency     No family history on file.  Past Surgical History:  Procedure Laterality Date  . FOOT SURGERY    . INCONTINENCE SURGERY    . SHOULDER SURGERY    . TONSILLECTOMY    . TOTAL HIP ARTHROPLASTY Right 09/27/2016   Procedure: RIGHT TOTAL HIP ARTHROPLASTY ANTERIOR APPROACH;  Surgeon: Kathryne Hitch, MD;  Location: [facility];  Service: Orthopedics;  Laterality: Right;   Social History   Occupational History  . Not on file  Tobacco Use  . Smoking status: Former Smoker    Types: Cigarettes    Last attempt to quit: 04/15/2016    Years since quitting: 1.4  . Smokeless tobacco:  Never Used  Substance and Sexual Activity  . Alcohol use: Yes    Alcohol/week: 1.2 - 1.8 oz    Types: 2 - 3 Glasses of wine per week    Comment: occasional  . Drug use: No  . Sexual activity: Never

## 2017-10-07 ENCOUNTER — Encounter (INDEPENDENT_AMBULATORY_CARE_PROVIDER_SITE_OTHER): Payer: Self-pay | Admitting: Physical Medicine and Rehabilitation

## 2017-10-16 ENCOUNTER — Encounter: Payer: Self-pay | Admitting: Sports Medicine

## 2017-10-22 ENCOUNTER — Encounter: Payer: Self-pay | Admitting: Sports Medicine

## 2017-10-28 ENCOUNTER — Encounter: Payer: Self-pay | Admitting: Sports Medicine

## 2017-10-30 ENCOUNTER — Other Ambulatory Visit: Payer: Self-pay

## 2017-10-30 ENCOUNTER — Encounter: Payer: Self-pay | Admitting: Sports Medicine

## 2017-10-30 DIAGNOSIS — M47812 Spondylosis without myelopathy or radiculopathy, cervical region: Secondary | ICD-10-CM

## 2017-10-30 DIAGNOSIS — M25511 Pain in right shoulder: Secondary | ICD-10-CM

## 2017-10-30 DIAGNOSIS — G8929 Other chronic pain: Secondary | ICD-10-CM

## 2017-10-30 DIAGNOSIS — M797 Fibromyalgia: Secondary | ICD-10-CM

## 2017-11-11 ENCOUNTER — Encounter: Payer: Self-pay | Admitting: Sports Medicine

## 2017-11-11 ENCOUNTER — Encounter (INDEPENDENT_AMBULATORY_CARE_PROVIDER_SITE_OTHER): Payer: Self-pay | Admitting: Physical Medicine and Rehabilitation

## 2017-11-11 ENCOUNTER — Other Ambulatory Visit: Payer: Self-pay | Admitting: Sports Medicine

## 2017-11-12 ENCOUNTER — Ambulatory Visit: Payer: Self-pay | Admitting: Sports Medicine

## 2017-11-12 MED ORDER — TRAMADOL HCL 50 MG PO TABS: 50.0000 mg | ORAL_TABLET | Freq: Three times a day (TID) | ORAL | 0 refills | Status: DC | PRN

## 2017-11-12 NOTE — Addendum Note (Signed)
Addended by: Gaspar BiddingIGBY, MICHAEL D on: 11/12/2017 04:44 PM   Modules accepted: Orders

## 2017-11-12 NOTE — Telephone Encounter (Signed)
OK to refill? Last OV 07/22/17, next OV 08/18/17, last refill 07/23/17 #60/0. Forwarding to Dr. Berline Choughigby to advise.

## 2017-11-12 NOTE — Progress Notes (Deleted)
  Veverly FellsMichael D. Delorise Shinerigby, DO  [facility] [phone]  Christine PatrickDonna M Kirby - 62 y.o. female MRN [id number]  Date of birth: 29-Sep-1955  Visit Date: 11/12/2017  PCP: April MansonWhite, Marsha L, NP   Referred by: April MansonWhite, Marsha L, NP  Scribe(s) for today's visit: Stevenson ClinchBrandy Daira Hine, CMA  SUBJECTIVE:  [name] is here for No chief complaint on file.   04/07/2017: Compared to the last office visit, her previously described symptoms have varied in severity. At times her joint and pain will be improved and other times the pain will be a lot worse. She has been experiencing extremely painful cramps in her legs and feet at night.  Current symptoms vary from mild at times to severe at other times to the point where she doesn't even want anyone to touch her.  She had an injection in her hip by Dr. Magnus IvanBlackman and she did get short term relief with this. She was given home exercises which she has been doing when the pain isn't severe.   07/22/2017: L-side lower back pain started flaring up about 2 weeks ago and has been intermittent. The pain radiates into the mid-thoracic spine. She denies groin pain. Pain is severe at times, described as throbbing and aching at rest but sharp with movement, like "scissors sharp" or "a shot of electricity".  She has noticed change with bowel movements, only have 1 BM every 3-4 days.  Pain is keeping her up at night.  She has been alternating heat and ice, heat seems to help more. She has been taking IBU with some relief. She has also tried taking a muscle relaxer with minimal relief.  04/21/2017 MRI L-spine  11/12/2017: Compared to the last office visit, her previously described symptoms {Improving/worsening/no change:60406} {*describe change} Current symptoms are {mild/mod/severe:3049053} & are {Pain radiation-gi:31246} She has been {*therapies/medications/ice/heat/bracing} {prev rx:317276}   REVIEW OF  SYSTEMS: {ACTIONS;DENIES/REPORTS:21021675::"Denies"} night time disturbances. {ACTIONS;DENIES/REPORTS:21021675::"Denies"} fevers, chills, or night sweats. {ACTIONS;DENIES/REPORTS:21021675::"Denies"} unexplained weight loss. {ACTIONS;DENIES/REPORTS:21021675::"Denies"} personal history of cancer. {ACTIONS;DENIES/REPORTS:21021675::"Denies"} changes in bowel or bladder habits. {ACTIONS;DENIES/REPORTS:21021675::"Denies"} recent unreported falls. {ACTIONS;DENIES/REPORTS:21021675::"Denies"} new or worsening dyspnea or wheezing. {ACTIONS;DENIES/REPORTS:21021675::"Denies"} headaches or dizziness.  {ACTIONS;DENIES/REPORTS:21021675::"Denies"} numbness, tingling or weakness  In the extremities.  {ACTIONS;DENIES/REPORTS:21021675::"Denies"} dizziness or presyncopal episodes {ACTIONS;DENIES/REPORTS:21021675::"Denies"} lower extremity edema   ***

## 2017-11-13 ENCOUNTER — Encounter: Payer: Self-pay | Admitting: Sports Medicine

## 2017-11-17 ENCOUNTER — Encounter: Payer: Self-pay | Admitting: Sports Medicine

## 2017-11-17 ENCOUNTER — Ambulatory Visit: Payer: Self-pay

## 2017-11-17 ENCOUNTER — Other Ambulatory Visit: Payer: Self-pay | Admitting: Sports Medicine

## 2017-11-17 ENCOUNTER — Ambulatory Visit: Payer: BLUE CROSS/BLUE SHIELD | Admitting: Sports Medicine

## 2017-11-17 DIAGNOSIS — G8929 Other chronic pain: Secondary | ICD-10-CM

## 2017-11-17 DIAGNOSIS — M25511 Pain in right shoulder: Secondary | ICD-10-CM

## 2017-11-17 MED ORDER — CYCLOBENZAPRINE HCL 10 MG PO TABS: 10.0000 mg | ORAL_TABLET | Freq: Three times a day (TID) | ORAL | 0 refills | Status: DC | PRN

## 2017-11-17 NOTE — Procedures (Signed)
PROCEDURE NOTE:  Ultrasound Guided: Injection: Right shoulder, AC Joint Images were obtained and interpreted by myself, Gaspar BiddingMichael Kawthar Ennen, DO  Images have been saved and stored to PACS system. Images obtained on: GE S7 Ultrasound machine    ULTRASOUND FINDINGS:  AC joint arthropathy is present with a positive mushroom sign.  DESCRIPTION OF PROCEDURE:  The patient's clinical condition is marked by substantial pain and/or significant functional disability. Other conservative therapy has not provided relief, is contraindicated, or not appropriate. There is a reasonable likelihood that injection will significantly improve the patient's pain and/or functional impairment.   After discussing the risks, benefits and expected outcomes of the injection and all questions were reviewed and answered, the patient wished to undergo the above named procedure.  Verbal consent was obtained.  The ultrasound was used to identify the target structure and adjacent neurovascular structures. The skin was then prepped in sterile fashion and the target structure was injected under direct visualization using sterile technique as below:  Single injection performed as below: PREP: Alcohol and Ethel Chloride APPROACH:direct, single injection, 25g 1.5 in. INJECTATE: 1 cc 0.5% Marcaine and 1 cc 40mg /mL DepoMedrol ASPIRATE: None DRESSING: Band-Aid  Post procedural instructions including recommending icing and warning signs for infection were reviewed.    This procedure was well tolerated and there were no complications.   IMPRESSION: Succesful Ultrasound Guided: Injection

## 2017-11-17 NOTE — Progress Notes (Signed)
Christine FellsMichael D. Kirby Shinerigby, DO  [facility] [phone]  Christine PatrickDonna M Ogando - 62 y.o. female MRN [id number]  Date of birth: 10-20-1955  Visit Date: 11/17/2017  PCP: April MansonWhite, Marsha L, NP   Referred by: April MansonWhite, Marsha L, NP  Scribe(s) for today's visit: Christoper FabianMolly Weber, LAT, ATC  SUBJECTIVE:  [name] is here for Follow-up (R shoulder and low back pain) .     04/07/2017: Compared to the last office visit, her previously described symptoms have varied in severity. At times her joint and pain will be improved and other times the pain will be a lot worse. She has been experiencing extremely painful cramps in her legs and feet at night.  Current symptoms vary from mild at times to severe at other times to the point where she doesn't even want anyone to touch her.  She had an injection in her hip by Dr. Magnus IvanBlackman and she did get short term relief with this. She was given home exercises which she has been doing when the pain isn't severe.   07/22/2017: L-side lower back pain started flaring up about 2 weeks ago and has been intermittent. The pain radiates into the mid-thoracic spine. She denies groin pain. Pain is severe at times, described as throbbing and aching at rest but sharp with movement, like "scissors sharp" or "a shot of electricity".  She has noticed change with bowel movements, only have 1 BM every 3-4 days.  Pain is keeping her up at night.  She has been alternating heat and ice, heat seems to help more. She has been taking IBU with some relief. She has also tried taking a muscle relaxer with minimal relief.    04/21/2017 MRI L-spine  11/17/2017: Compared to the last office visit 07/22/17, her previously described shoulder pain symptoms are worsening.  Pt states that her R shoulder starting flaring up about 4 weeks ago when she started water aerobics.  She states that overall she feels better when going to water aerobics but her shoulder and R  UE are bothering her in terms of decreased R UE ROM.  She states that she is also experiencing increased swelling in her B ankles and her fingers.  R-sided LBP in her glutes remains an issue. Current symptoms are moderate & are nonradiating.  She is also experiencing some R thumb pain She stopped taking her Lyrica as of 10/24/17 and notes decreased swelling but increased muscle pain but has resumed taking it at this point.  C-spine MRI 11/23/16 and L-spine MRI - 04/21/17  L5-S1 fFacet injection w. Dr. Alvester MorinNewton - 08/21/17  Right shoulder seems to be the biggest issue for her today.  Her thumb is also bothering her and does cause some thumb tingling to occur.  Question whether or not this is the [facility] joint.  She is doing water aerobics/pool exercises 3 times a week.  This is helping.  REVIEW OF SYSTEMS: Reports night time disturbances. Denies fevers, chills, or night sweats. Denies unexplained weight loss. Denies personal history of cancer. Denies changes in bowel or bladder habits. Denies recent unreported falls. Denies new or worsening dyspnea or wheezing. Denies headaches or dizziness.  Denies numbness, tingling or weakness  In the extremities. Denies dizziness or presyncopal episodes Reports lower extremity edema    HISTORY & PERTINENT PRIOR DATA:  Prior History reviewed and updated per electronic medical record.  Significant/pertinent history, findings, studies include:  reports that she quit smoking about 20 months  ago. Her smoking use included cigarettes. She has never used smokeless tobacco. No results for input(s): HGBA1C, LABURIC, CREATINE in the last 8760 hours. No specialty comments available. No problems updated.  OBJECTIVE:  VS:  HT:5\' 10"  (177.8 cm)   WT:201 lb 9.6 oz (91.4 kg)  BMI:28.93    BP:122/74  HR:74bpm  TEMP: ( )  RESP:95 %   Wt Readings from Last 10 Encounters:  11/17/17 201 lb 9.6 oz (91.4 kg)  07/22/17 200 lb 3.2 oz (90.8 kg)  04/07/17 195 lb (88.5 kg)    01/20/17 194 lb (88 kg)  11/26/16 187 lb 12.8 oz (85.2 kg)  09/27/16 190 lb (86.2 kg)  09/18/16 190 lb (86.2 kg)  08/28/16 186 lb (84.4 kg)  08/19/16 186 lb 9.6 oz (84.6 kg)  07/30/16 184 lb 9.6 oz (83.7 kg)    PHYSICAL EXAM: Constitutional: WDWN, Non-toxic appearing. Psychiatric: Alert & appropriately interactive.  Not depressed or anxious appearing. Respiratory: No increased work of breathing.  Trachea Midline Eyes: Pupils are equal.  EOM intact without nystagmus.  No scleral icterus  Vascular Exam: warm to touch no edema  lower extremity neuro exam: unremarkable normal strength normal sensation  MSK Exam: Right lower extremity is overall well aligned, no significant deformity.   Full overhead range of motion.  Of her bilateral shoulders.   ASSESSMENT & PLAN:   1. Chronic right shoulder pain     PLAN: Multifactorial pain.  Ultimately today does seem that her right shoulder and hand are bothering her the most of this is likely coming from shoulder hand syndrome.  The [facility] joint seems to be the most significant of her symptoms today and we did go ahead and inject that per procedure note.  We will have her continue with home therapeutic exercises as well as pool therapy and counseled on chronic ongoing pain issues.  If any focal findings she will follow-up otherwise we will plan to see her back as needed.  >50% of this 25 minute visit spent in direct patient counseling and/or coordination of care.  Discussion was focused on education regarding the in discussing the pathoetiology and anticipated clinical course of the above condition.  Follow-up: Return if symptoms worsen or fail to improve.       Please see additional documentation for Objective, Assessment and Plan sections. Pertinent additional documentation may be included in corresponding procedure notes, imaging studies, problem based documentation and patient instructions. Please see these sections of the encounter  for additional information regarding this visit.  CMA/ATC served as Neurosurgeon during this visit. History, Physical, and Plan performed by medical provider. Documentation and orders reviewed and attested to.      Andrena Mews, DO    [facility] Phys[facility]

## 2017-11-17 NOTE — Patient Instructions (Signed)

## 2017-11-18 ENCOUNTER — Encounter: Payer: Self-pay | Admitting: Sports Medicine

## 2017-11-21 ENCOUNTER — Ambulatory Visit: Payer: Self-pay | Admitting: Sports Medicine

## 2017-12-16 ENCOUNTER — Encounter: Payer: Self-pay | Admitting: Sports Medicine

## 2018-01-05 NOTE — Procedures (Deleted)
PROCEDURE NOTE:  Ultrasound Guided: Injection: Right shoulder, AC joint Images were obtained and interpreted by myself, Gaspar BiddingMichael Rigby, DO  Images have been saved and stored to PACS system. Images obtained on: GE S7 Ultrasound machine    ULTRASOUND FINDINGS:  Positive mushroom sign  DESCRIPTION OF PROCEDURE:  The patient's clinical condition is marked by substantial pain and/or significant functional disability. Other conservative therapy has not provided relief, is contraindicated, or not appropriate. There is a reasonable likelihood that injection will significantly improve the patient's pain and/or functional impairment.   After discussing the risks, benefits and expected outcomes of the injection and all questions were reviewed and answered, the patient wished to undergo the above named procedure.  Verbal consent was obtained.  The ultrasound was used to identify the target structure and adjacent neurovascular structures. The skin was then prepped in sterile fashion and the target structure was injected under direct visualization using sterile technique as below:  Single injection performed as below: PREP: Alcohol and Ethel Chloride APPROACH:direct, single injection, 25g 1.5 in. INJECTATE: 1 cc 0.5% Marcaine and 1 cc 80mg /mL DepoMedrol ASPIRATE: None DRESSING: Band-Aid  Post procedural instructions including recommending icing and warning signs for infection were reviewed.    This procedure was well tolerated and there were no complications.   IMPRESSION: Succesful Ultrasound Guided: Injection

## 2018-01-06 ENCOUNTER — Encounter (INDEPENDENT_AMBULATORY_CARE_PROVIDER_SITE_OTHER): Payer: Self-pay | Admitting: Orthopaedic Surgery

## 2018-01-07 ENCOUNTER — Telehealth (INDEPENDENT_AMBULATORY_CARE_PROVIDER_SITE_OTHER): Payer: Self-pay | Admitting: Orthopaedic Surgery

## 2018-01-07 NOTE — Telephone Encounter (Signed)
Returned call to patient left message to call back  [phone]

## 2018-01-08 ENCOUNTER — Ambulatory Visit (INDEPENDENT_AMBULATORY_CARE_PROVIDER_SITE_OTHER): Payer: BLUE CROSS/BLUE SHIELD | Admitting: Orthopaedic Surgery

## 2018-01-08 ENCOUNTER — Ambulatory Visit (INDEPENDENT_AMBULATORY_CARE_PROVIDER_SITE_OTHER): Payer: BLUE CROSS/BLUE SHIELD

## 2018-01-08 ENCOUNTER — Encounter (INDEPENDENT_AMBULATORY_CARE_PROVIDER_SITE_OTHER): Payer: Self-pay | Admitting: Orthopaedic Surgery

## 2018-01-08 DIAGNOSIS — M25551 Pain in right hip: Secondary | ICD-10-CM | POA: Diagnosis not present

## 2018-01-08 DIAGNOSIS — Z96641 Presence of right artificial hip joint: Secondary | ICD-10-CM | POA: Diagnosis not present

## 2018-01-08 DIAGNOSIS — M7061 Trochanteric bursitis, right hip: Secondary | ICD-10-CM

## 2018-01-08 MED ORDER — METHYLPREDNISOLONE ACETATE 40 MG/ML IJ SUSP
40.0000 mg | INTRAMUSCULAR | Status: AC | PRN
  Administered 2018-01-08: 40 mg via INTRA_ARTICULAR

## 2018-01-08 MED ORDER — LIDOCAINE HCL 1 % IJ SOLN
3.0000 mL | INTRAMUSCULAR | Status: AC | PRN
  Administered 2018-01-08: 3 mL

## 2018-01-08 NOTE — Progress Notes (Signed)
Office Visit Note   Patient: [name]           Date of Birth: August 13, 1955           MRN: [id number] Visit Date: 01/08/2018              Requested by: April Manson, NP [address] [place] [address] PCP: April Manson, NP   Assessment & Plan: Visit Diagnoses:  1. Pain in right hip   2. Status post total replacement of right hip   3. Trochanteric bursitis of right hip     Plan: I do feel this is trochanteric bursitis of her hip based on her clinical exam and symptoms.  I placed a steroid injection in this area to help her symptoms calm down.  I did show her stretching and I want her to do twice daily but with but with 3 sets each.  If this does not calm down I have stressed to her the importance of outpatient physical therapy.  She will call us because at that point.  Otherwise follow-up will be as needed.  Follow-Up Instructions: Return if symptoms worsen or fail to improve.   Orders:  Orders Placed This Encounter  Procedures  . Large Joint Inj  . XR HIP UNILAT W OR W/O PELVIS 1V RIGHT   No orders of the defined types were placed in this encounter.     Procedures: Large Joint Inj: R greater trochanter on 01/08/2018 1:17 PM Indications: pain and diagnostic evaluation Details: 22 G 1.5 in needle, lateral approach  Arthrogram: No  Medications: 3 mL lidocaine 1 %; 40 mg methylPREDNISolone acetate 40 MG/ML Outcome: tolerated well, no immediate complications Procedure, treatment alternatives, risks and benefits explained, specific risks discussed. Consent was given by the patient. Immediately prior to procedure a time out was called to verify the correct patient, procedure, equipment, support staff and site/side marked as required. Patient was prepped and draped in the usual sterile fashion.       Clinical Data: No additional findings.   Subjective: Chief Complaint  Patient presents with  . Right Hip - Pain  The patient is here today with  right hip pain.  She is over a year out from a total hip arthroplasty.  She still having pain on the lateral aspect of her hip and known trochanteric bursitis and she is marked out where she hurts.  She is getting ready had to [facility] and would like an injection.  She denies any groin pain.  She denies any back issues but she does have a history of spondylosis of lumbar spine.  HPI  Review of Systems She currently denies any headache, chest pain, short of breath, fever, chills, nausea, vomiting.  Objective: Vital Signs: There were no vitals taken for this visit.  Physical Exam She is alert and oriented x3 and in no acute distress Ortho Exam Examination of her right hip shows that moves fluidly without any issues at all.  Her pain is only to palpation of the trochanteric area. Specialty Comments:  No specialty comments available.  Imaging: Xr Hip Unilat W Or W/o Pelvis 1v Right  Result Date: 01/08/2018 An AP pelvis and lateral the right hip shows a total hip arthroplasty with no complicating features.    PMFS History: Patient Active Problem List   Diagnosis Date Noted  . Trochanteric bursitis of right hip 04/02/2017  . Fibromyalgia 01/22/2017  . Carpal tunnel syndrome 12/15/2016  .  Status post total replacement of right hip 09/27/2016  . Polyarthralgia 07/30/2016  . Chronic right shoulder pain 07/30/2016  . Right hand pain 07/30/2016  . Facet arthritis of lumbar region 05/20/2016  . Facet arthritis of cervical region 05/20/2016  . Primary osteoarthritis of both hips 12/18/2015  . Chronic bronchitis (HCC) 09/25/2015  . Essential hypertension 09/25/2015  . GAD (generalized anxiety disorder) 09/25/2015  . Gastroesophageal reflux disease without esophagitis 09/25/2015  . Hyperlipemia 09/25/2015  . Diabetes mellitus with complication (HCC) 09/25/2015   Past Medical History:  Diagnosis Date  . Anxiety and depression   . Asthma    related to seasonal allergies  .  Avascular necrosis of hip, right (HCC) 09/27/2016  . AVN of femur (HCC) 07/30/2016  . Diabetes mellitus with complication (HCC)    microalbuminuria 03/2015  . GERD (gastroesophageal reflux disease)   . Hyperlipidemia, mixed 07/2015  . Hypertension   . Vitamin D deficiency     History reviewed. No pertinent family history.  Past Surgical History:  Procedure Laterality Date  . FOOT SURGERY    . INCONTINENCE SURGERY    . SHOULDER SURGERY    . TONSILLECTOMY    . TOTAL HIP ARTHROPLASTY Right 09/27/2016   Procedure: RIGHT TOTAL HIP ARTHROPLASTY ANTERIOR APPROACH;  Surgeon: Kathryne HitchBlackman, Hayle Parisi Y, MD;  Location: [facility];  Service: Orthopedics;  Laterality: Right;   Social History   Occupational History  . Not on file  Tobacco Use  . Smoking status: Former Smoker    Types: Cigarettes    Last attempt to quit: 04/15/2016    Years since quitting: 1.7  . Smokeless tobacco: Never Used  Substance and Sexual Activity  . Alcohol use: Yes    Alcohol/week: 2.0 - 3.0 standard drinks    Types: 2 - 3 Glasses of wine per week    Comment: occasional  . Drug use: No  . Sexual activity: Never

## 2018-01-09 ENCOUNTER — Other Ambulatory Visit: Payer: Self-pay | Admitting: Sports Medicine

## 2018-01-09 ENCOUNTER — Encounter: Payer: Self-pay | Admitting: Sports Medicine

## 2018-01-09 MED ORDER — DULOXETINE HCL 30 MG PO CPEP: 30.0000 mg | ORAL_CAPSULE | Freq: Two times a day (BID) | ORAL | 2 refills | Status: DC

## 2018-01-09 NOTE — Telephone Encounter (Signed)
Patient's MyChart message sent today:  Dr.Rigby,  The [facility] closed last month we [facility] [place] I went to have my Cymbalta refilled, they have sent two requests to your office ( they said).  This has been going on all week, [place] so you can see why I have tried to have my medications with me. I can only assume that I no longer need this medication, but we never had a discussion about this. So where are we with this medications?    Cymbalta refill Last refill:09/05/17 #60/2 refill Last OV:11/17/17 Following Dr. Berline Choughigby for orthopedic issues [id number] [place], [place] - [address] [phone] (Phone) [phone] (Fax)

## 2018-01-09 NOTE — Telephone Encounter (Signed)
Copied from CRM [phone]. Topic: General - Other >> Jan 09, 2018  1:09 PM Ronney LionArrington, Shykila A wrote: Medication: DULoxetine (CYMBALTA) 30 MG capsule    (Agent: If yes, when and what did the pharmacy advise? New pharmacy on file.  Preferred Pharmacy (with phone number or street name): [id number] [place], [place] - [address]  [phone] (Phone) [phone] (Fax)

## 2018-01-09 NOTE — Telephone Encounter (Signed)
Forwarding to Dr. Berline Choughigby to advise. Per last MyChart note -   "You are correct that medications can cause swelling and Lyrica and Cymbalta can be culprits in this however it is difficult to tell you to stop the since they seem to be giving you some relief of some of the symptoms that you are having."

## 2018-01-12 NOTE — Telephone Encounter (Signed)
Rx refilled - called pt and left VM to call the office.

## 2018-01-16 NOTE — Telephone Encounter (Signed)
Per prior telephone note -  I am sorry to hear you have been trying get this to be filled all week. We have not received anything from [facility]. I have sent in prescriptions so that you can continue this medicine if you would like. As we previously communicated during the last my chart messages I do think you should continue on this if it is beneficial for you.

## 2018-01-19 ENCOUNTER — Other Ambulatory Visit: Payer: Self-pay

## 2018-01-19 NOTE — Telephone Encounter (Signed)
Letter received from [employer] patient assistance requesting new rx (refill) for Lyrica. Can be sent in electronically to [facility].   Last OV 11/17/2017

## 2018-01-20 MED ORDER — PREGABALIN 75 MG PO CAPS: 75.0000 mg | ORAL_CAPSULE | Freq: Two times a day (BID) | ORAL | 3 refills | Status: DC

## 2018-02-15 IMAGING — DX DG LUMBAR SPINE 2-3V
3 series · 3 of 3 positions shown · non-contrast
Comparison: None.

CLINICAL DATA: Low back pain for several weeks, no known injury,
initial encounter

EXAM:
LUMBAR SPINE - 2-3 VIEW

[lumbar spine ap]
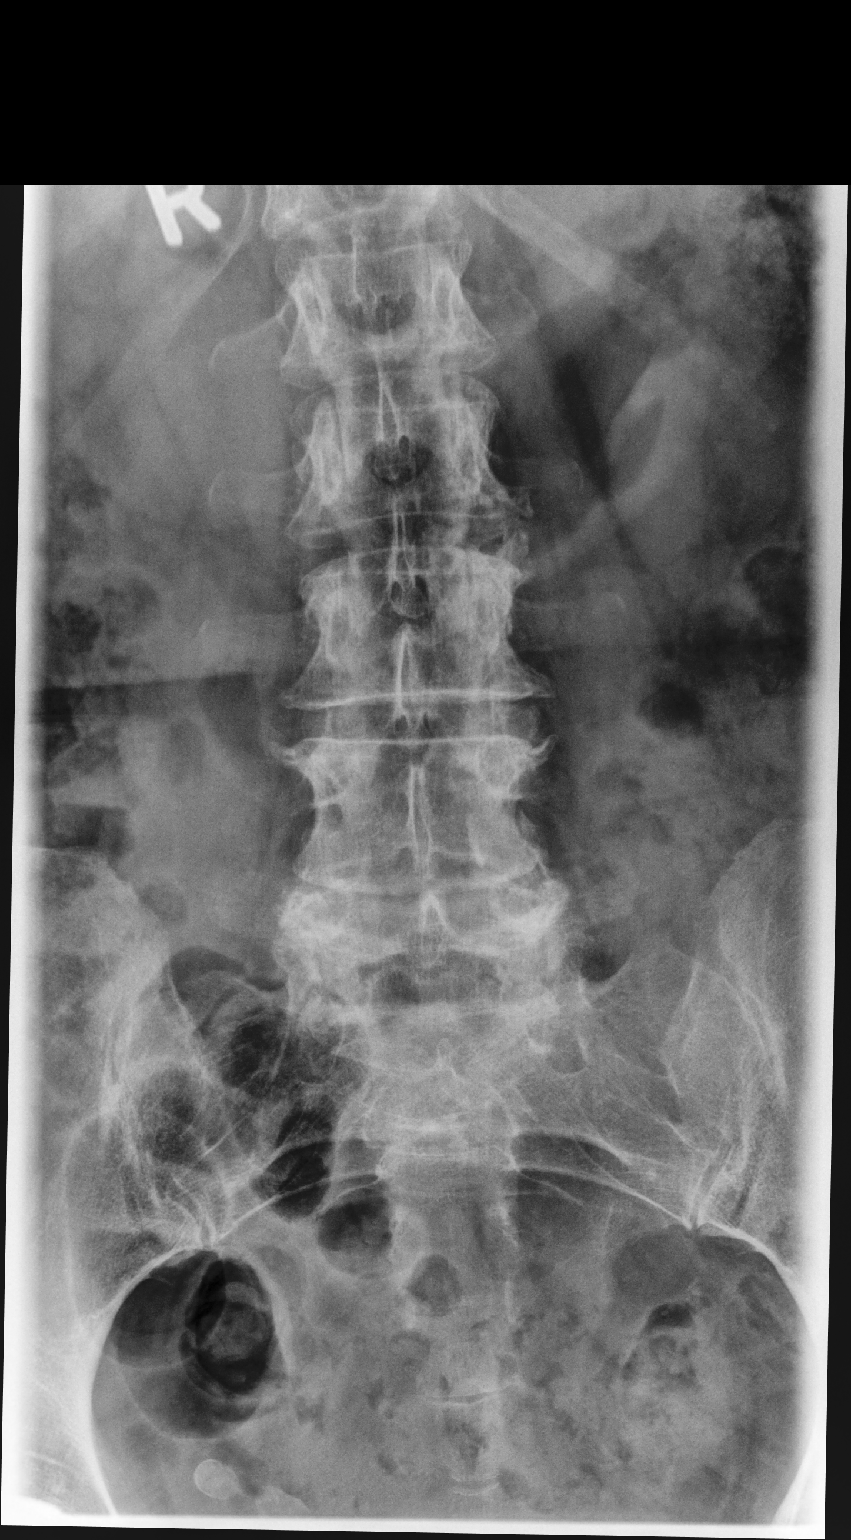

[lumbar spine lat (1 of 2)]
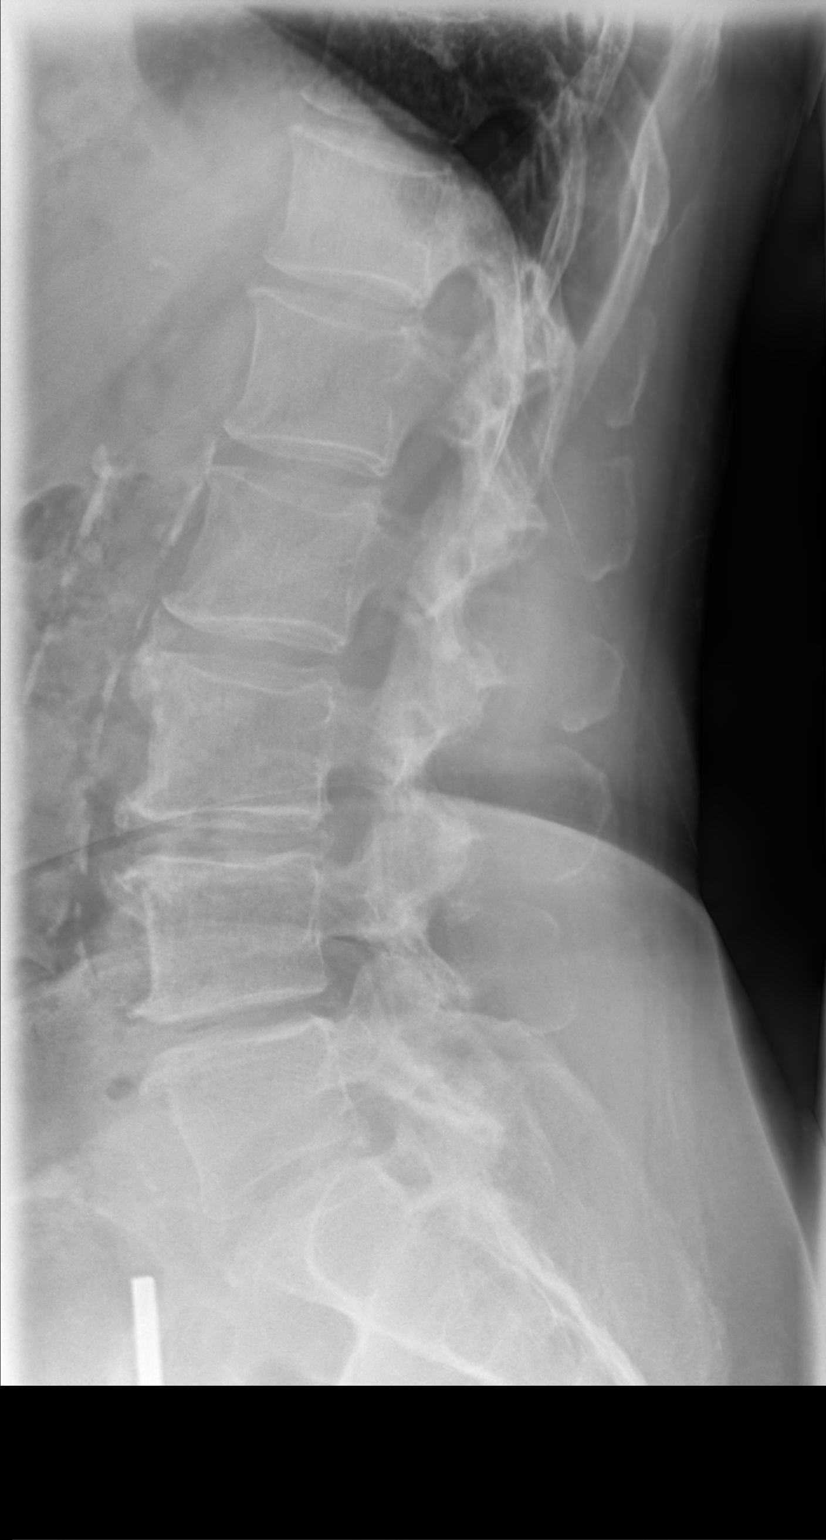

[lumbar spine lat (2 of 2)]
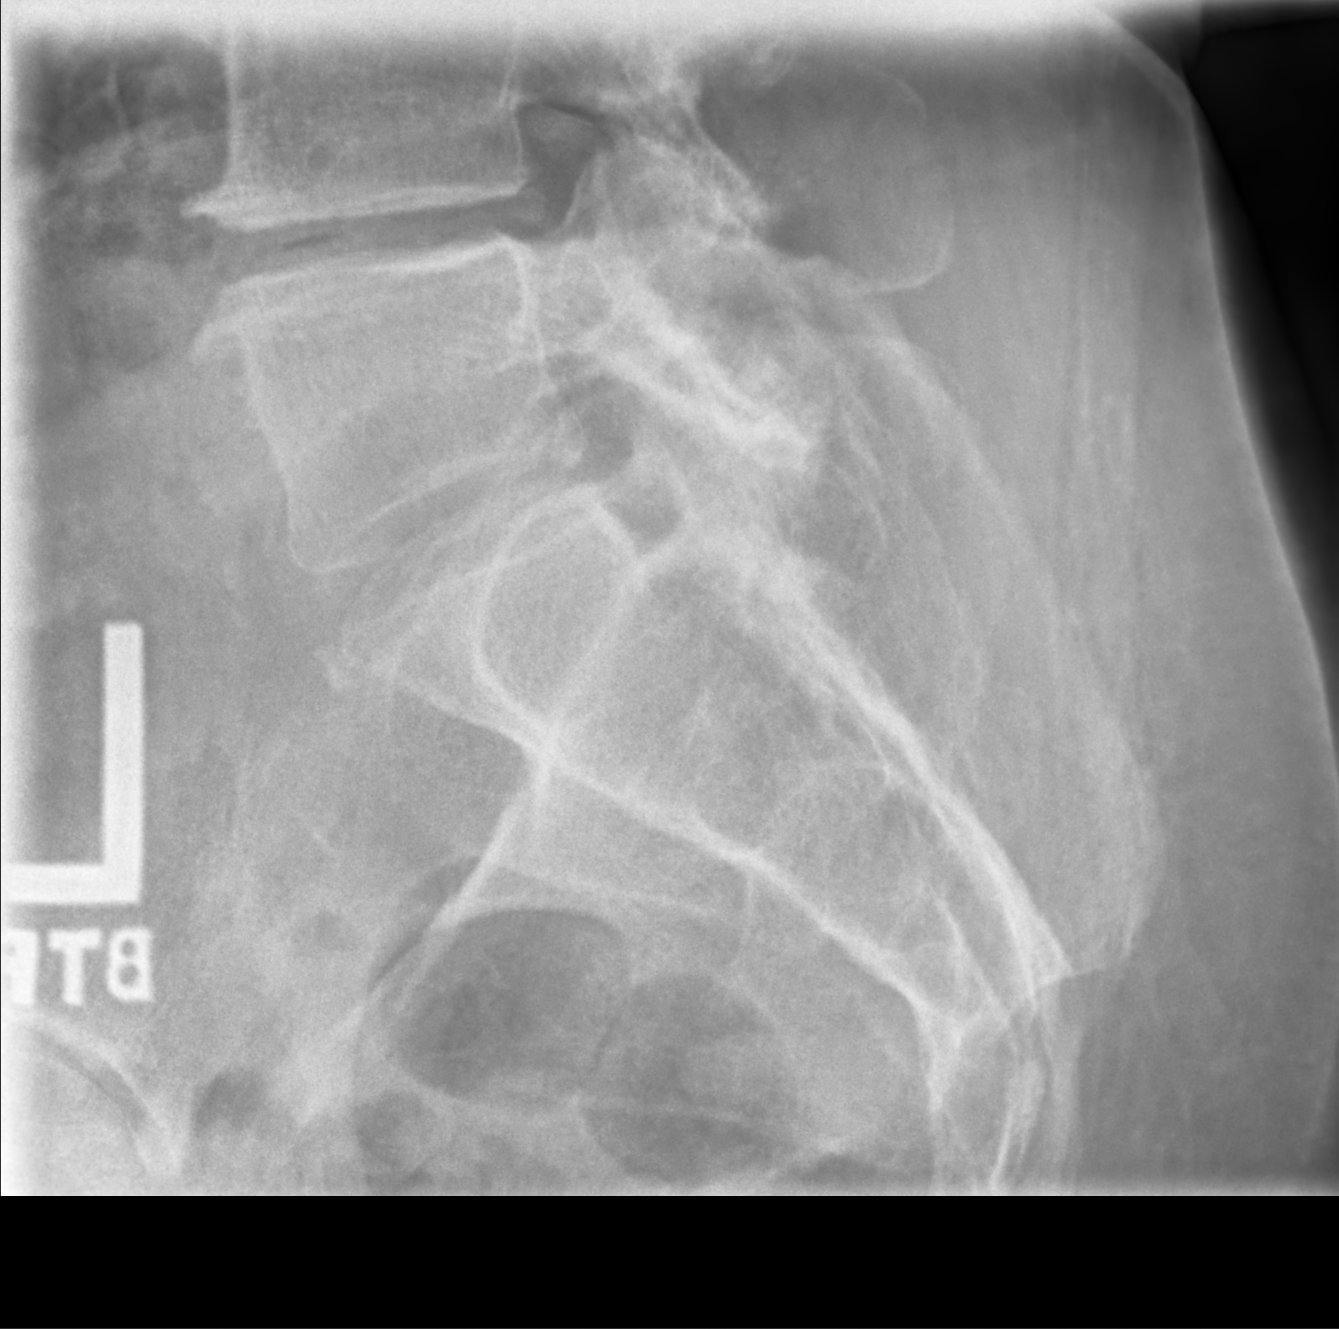

[3 of 3 positions shown; findings below may reference images not displayed]

FINDINGS: Five lumbar type vertebral bodies are well visualized. Vertebral
body height is well maintained. Osteophytic changes are noted
throughout the lumbar spine. No anterolisthesis is seen. Mild facet
hypertrophic changes are noted. Aortic calcifications are seen
without definitive aneurysmal dilatation.
IMPRESSION: Degenerative change without acute abnormality.

## 2018-03-16 ENCOUNTER — Other Ambulatory Visit: Payer: Self-pay

## 2018-03-16 MED ORDER — PREGABALIN 75 MG PO CAPS: 75.0000 mg | ORAL_CAPSULE | Freq: Two times a day (BID) | ORAL | 3 refills | Status: DC

## 2018-05-06 DIAGNOSIS — F5102 Adjustment insomnia: Secondary | ICD-10-CM | POA: Insufficient documentation

## 2018-05-06 DIAGNOSIS — F4381 Prolonged grief disorder: Secondary | ICD-10-CM | POA: Insufficient documentation

## 2018-05-06 DIAGNOSIS — F332 Major depressive disorder, recurrent severe without psychotic features: Secondary | ICD-10-CM | POA: Insufficient documentation

## 2018-05-06 DIAGNOSIS — F4329 Adjustment disorder with other symptoms: Secondary | ICD-10-CM | POA: Insufficient documentation

## 2018-06-19 ENCOUNTER — Ambulatory Visit: Payer: Self-pay

## 2018-06-19 NOTE — Telephone Encounter (Signed)
Patient called in and says that she fell last Saturday and hit her butt then fell onto her back. She says she has pain to her lower back at a 10 at times, but today a 7. She says she can move normally. She denies weakness/numbness to arms or legs and any other symptoms. She says sometime she's urinating dribbles. I advised to call her primary about this issue, she verbalized understanding. She says she's seen Dr. Berline Chough in the past and she was waiting on her insurance to become active before coming back to see him. Appointment scheduled for Monday, 06/22/18 at 1140 with Dr. Berline Chough, care advice given, patient verbalized understanding.  Reason for Disposition . [1] After 3 days (72 hours) AND [2] back pain not improving  Answer Assessment - Initial Assessment Questions 1. MECHANISM: "How did the injury happen?" (Consider the possibility of domestic violence or elder abuse)     Feel last Saturday butt first, then fell back 2. ONSET: "When did the injury happen?" (Minutes or hours ago)     Last Saturday 3. LOCATION: "What part of the back is injured?"     Lower back, tailbone 4. SEVERITY: "Can you move the back normally?"     Yes 5. PAIN: "Is there any pain?" If so, ask: "How bad is the pain?"   (Scale 1-10; or mild, moderate, severe)     10 at times, now it's a 7 6. CORD SYMPTOMS: Any weakness or numbness of the arms or legs?"     No 7. SIZE: For cuts, bruises, or swelling, ask: "How large is it?" (e.g., inches or centimeters)     N/A 8. TETANUS: For any breaks in the skin, ask: "When was the last tetanus booster?"     N/A 9. OTHER SYMPTOMS: "Do you have any other symptoms?" (e.g., abdominal pain, blood in urine)     No 10. PREGNANCY: "Is there any chance you are pregnant?" "When was your last menstrual period?"       N/A  Protocols used: BACK INJURY-A-AH

## 2018-06-22 ENCOUNTER — Ambulatory Visit (INDEPENDENT_AMBULATORY_CARE_PROVIDER_SITE_OTHER): Payer: Medicare HMO | Admitting: Sports Medicine

## 2018-06-22 ENCOUNTER — Encounter: Payer: Self-pay | Admitting: Sports Medicine

## 2018-06-22 ENCOUNTER — Ambulatory Visit: Payer: Self-pay | Admitting: Sports Medicine

## 2018-06-22 VITALS — BP 120/68 | HR 63 | Ht 70.0 in | Wt 186.6 lb

## 2018-06-22 DIAGNOSIS — M25511 Pain in right shoulder: Secondary | ICD-10-CM

## 2018-06-22 DIAGNOSIS — G894 Chronic pain syndrome: Secondary | ICD-10-CM

## 2018-06-22 DIAGNOSIS — W19XXXA Unspecified fall, initial encounter: Secondary | ICD-10-CM

## 2018-06-22 DIAGNOSIS — M797 Fibromyalgia: Secondary | ICD-10-CM | POA: Diagnosis not present

## 2018-06-22 DIAGNOSIS — M47816 Spondylosis without myelopathy or radiculopathy, lumbar region: Secondary | ICD-10-CM | POA: Diagnosis not present

## 2018-06-22 DIAGNOSIS — G8929 Other chronic pain: Secondary | ICD-10-CM

## 2018-06-22 MED ORDER — TIZANIDINE HCL 4 MG PO TABS: 4.0000 mg | ORAL_TABLET | Freq: Three times a day (TID) | ORAL | 1 refills | Status: AC

## 2018-06-22 NOTE — Progress Notes (Signed)
[name]. [name] [id number]  FRONA FENNELL - 63 y.o. female MRN [id number]  Date of birth: 1955/04/24  Visit Date: June 22, 2018  PCP: April Manson, NP   Referred by: April Manson, NP  SUBJECTIVE:  Chief Complaint  Patient presents with  . Follow-up    Low back pain.  Fell on 06/13/18 and landed on butt/back.    HPI: Patient is here following a fall last Saturday, February 22.  She was removing poison ivy from her yard and fell landing directly on her buttock and back.  She has had slight worsening of her pain since that time.  Occasionally rates as moderate to severe.  Overall prior to the incident she was doing quite well and is being managed by Dr. Arbutus Ped with pain management.  She has been started on buprenorphine oral dissolving film with fairly good relief with this however did seem to be overly sedated at the 300 mg dose.  Prior to this she was on fairly high doses of oxycodone and was hoping to get off of this.  She is actually been out of this medication for several weeks even prior to the fall that she sustained.  She has had to take some of the remaining film since this occurred and is here more so just to ensure that no significant injury was sustained given the persistent pain.  Symptoms are actually worsening when she lays on her left side as well as having leftward rotation with her thorax.  Her knees and her hips are doing well.  Prior to this December she had been fairly active and water aerobics and is looking to get back into this now that her husband who was unfortunately hospitalized several times in November and December for bacterial infection.  She is trying to resume her activity level and is looking forward to getting back into the pool.  REVIEW OF SYSTEMS: No significant nighttime awakenings due to this issue. Denies fevers, chills, recent weight gain or weight loss.  No night sweats.  She  does have some persistent weakness in her right upper extremity that is unchanged.  HISTORY:  Prior history reviewed and updated per electronic medical record.  Patient Active Problem List   Diagnosis Date Noted  . Trochanteric bursitis of right hip 04/02/2017  . Fibromyalgia 01/22/2017  . Carpal tunnel syndrome 12/15/2016    Moderate right median nerve neuropathy on EMGs in 2018   . Status post total replacement of right hip 09/27/2016  . Polyarthralgia 07/30/2016  . Chronic right shoulder pain 07/30/2016  . Right hand pain 07/30/2016  . Facet arthritis of lumbar region 05/20/2016    L-spine XR - 01/20/17  L-spine MRI - 04/21/17    . Facet arthritis of cervical region 05/20/2016  . Primary osteoarthritis of both hips 12/18/2015  . Chronic bronchitis (HCC) 09/25/2015  . Essential hypertension 09/25/2015  . GAD (generalized anxiety disorder) 09/25/2015  . Gastroesophageal reflux disease without esophagitis 09/25/2015  . Hyperlipemia 09/25/2015  . Diabetes mellitus with complication (HCC) 09/25/2015   Social History   Occupational History  . Not on file  Tobacco Use  . Smoking status: Former Smoker    Types: Cigarettes    Last attempt to quit: 04/15/2016    Years since quitting: 2.1  . Smokeless tobacco: Never Used  Substance and Sexual Activity  . Alcohol use: Yes    Alcohol/week: 2.0 - 3.0 standard drinks  Types: 2 - 3 Glasses of wine per week    Comment: occasional  . Drug use: No  . Sexual activity: Never   Social History   Social History Narrative  . Not on file    OBJECTIVE:  VS:  HT:5\' 10"  (177.8 cm)   WT:186 lb 9.6 oz (84.6 kg)  BMI:26.77    BP:120/68  HR:63bpm  TEMP: ( )  RESP:91 %   PHYSICAL EXAM: Adult female. No acute distress.  Alert and appropriate. Overall she has good internal and external rotation of bilateral hips and knees have good flexion and extension.  She goes from sit to stand without significant difficulty.  Her thoracic  mobility is slightly limited with right words rotation but no focal bony tenderness over the mid thoracic spine.  There is some persistent pain over her midline lumbar spine but this is unchanged.  She has good lumbar mobility with side bending and rotation.  Functional limitations however are present.  Negative straight leg raise bilaterally.   ASSESSMENT:  1. Chronic right shoulder pain   2. Facet arthritis of lumbar region   3. Fibromyalgia   4. Chronic pain syndrome   5. Fall, initial encounter     PROCEDURES:  None  PLAN:  Pertinent additional documentation may be included in corresponding procedure notes, imaging studies, problem based documentation and patient instructions.  No problem-specific Assessment & Plan notes found for this encounter.   Overall she does seem to be doing fairly well.  She does have some paraspinal muscle spasms that are mild.  She will likely benefit from muscle relaxer and she has not tried Zanaflex and we discussed a therapeutic trial of this.  Ultimately further pain management does need to come from Dr. Arbutus Ped and she is aware of this.  I do think that buprenorphine is likely an appropriate therapeutic option for her and did provide reassurance that this is a relatively safe medicine and can be very effective for her underlying chronic pain condition.  Continue previously prescribed home exercise program.   Continue working in [employer].  Activity modifications and the importance of avoiding exacerbating activities (limiting pain to no more than a 4 / 10 during or following activity) recommended and discussed.  Discussed red flag symptoms that warrant earlier emergent evaluation and patient voices understanding.   Meds ordered this encounter  Medications  . tiZANidine (ZANAFLEX) 4 MG tablet    Sig: Take 1 tablet (4 mg total) by mouth 3 (three) times daily.    Dispense:  90 tablet    Refill:  1   Lab Orders  No laboratory test(s) ordered  today   Imaging Orders  No imaging studies ordered today   Referral Orders  No referral(s) requested today    Return if symptoms worsen or fail to improve.          Andrena Mews, DO    [facility]

## 2018-12-23 ENCOUNTER — Other Ambulatory Visit: Payer: Self-pay | Admitting: Family Medicine

## 2018-12-23 DIAGNOSIS — Z1231 Encounter for screening mammogram for malignant neoplasm of breast: Secondary | ICD-10-CM

## 2019-02-03 ENCOUNTER — Other Ambulatory Visit: Payer: Self-pay

## 2019-02-03 ENCOUNTER — Ambulatory Visit
Admission: RE | Admit: 2019-02-03 | Discharge: 2019-02-03 | Disposition: A | Discharge status: Home / Self Care | Payer: Medicare HMO | Source: Ambulatory Visit | Attending: Family Medicine | Admitting: Family Medicine

## 2019-02-03 DIAGNOSIS — Z1231 Encounter for screening mammogram for malignant neoplasm of breast: Secondary | ICD-10-CM

## 2019-05-25 ENCOUNTER — Emergency Department (HOSPITAL_COMMUNITY): Payer: Medicare HMO

## 2019-05-25 ENCOUNTER — Encounter (HOSPITAL_COMMUNITY): Payer: Self-pay | Admitting: Emergency Medicine

## 2019-05-25 ENCOUNTER — Other Ambulatory Visit: Payer: Self-pay

## 2019-05-25 ENCOUNTER — Emergency Department (HOSPITAL_COMMUNITY)
Admission: EM | Admit: 2019-05-25 | Discharge: 2019-05-25 | Disposition: A | Discharge status: Home / Self Care | Payer: Medicare HMO | Attending: Emergency Medicine | Admitting: Emergency Medicine

## 2019-05-25 DIAGNOSIS — E119 Type 2 diabetes mellitus without complications: Secondary | ICD-10-CM | POA: Diagnosis not present

## 2019-05-25 DIAGNOSIS — Z79899 Other long term (current) drug therapy: Secondary | ICD-10-CM | POA: Insufficient documentation

## 2019-05-25 DIAGNOSIS — Z7984 Long term (current) use of oral hypoglycemic drugs: Secondary | ICD-10-CM | POA: Insufficient documentation

## 2019-05-25 DIAGNOSIS — I10 Essential (primary) hypertension: Secondary | ICD-10-CM | POA: Insufficient documentation

## 2019-05-25 DIAGNOSIS — Z87891 Personal history of nicotine dependence: Secondary | ICD-10-CM | POA: Diagnosis not present

## 2019-05-25 DIAGNOSIS — Z96641 Presence of right artificial hip joint: Secondary | ICD-10-CM | POA: Diagnosis not present

## 2019-05-25 DIAGNOSIS — J45909 Unspecified asthma, uncomplicated: Secondary | ICD-10-CM | POA: Insufficient documentation

## 2019-05-25 DIAGNOSIS — N12 Tubulo-interstitial nephritis, not specified as acute or chronic: Secondary | ICD-10-CM | POA: Diagnosis not present

## 2019-05-25 DIAGNOSIS — R1031 Right lower quadrant pain: Secondary | ICD-10-CM | POA: Diagnosis present

## 2019-05-25 LAB — CBC
HCT: 37.3 % (ref 36.0–46.0)
Hemoglobin: 12.1 g/dL (ref 12.0–15.0)
MCH: 29.5 pg (ref 26.0–34.0)
MCHC: 32.4 g/dL (ref 30.0–36.0)
MCV: 91 fL (ref 80.0–100.0)
Platelets: 231 10*3/uL (ref 150–400)
RBC: 4.1 MIL/uL (ref 3.87–5.11)
RDW: 12.5 % (ref 11.5–15.5)
WBC: 9.6 10*3/uL (ref 4.0–10.5)
nRBC: 0 % (ref 0.0–0.2)

## 2019-05-25 LAB — COMPREHENSIVE METABOLIC PANEL
ALT: 20 U/L (ref 0–44)
AST: 16 U/L (ref 15–41)
Albumin: 4 g/dL (ref 3.5–5.0)
Alkaline Phosphatase: 74 U/L (ref 38–126)
Anion gap: 13 (ref 5–15)
BUN: 10 mg/dL (ref 8–23)
CO2: 22 mmol/L (ref 22–32)
Calcium: 8.6 mg/dL — ABNORMAL LOW (ref 8.9–10.3)
Chloride: 102 mmol/L (ref 98–111)
Creatinine, Ser: 0.55 mg/dL (ref 0.44–1.00)
GFR calc Af Amer: >60 mL/min (ref >60)
GFR calc non Af Amer: >60 mL/min (ref >60)
Glucose, Bld: 129 mg/dL — ABNORMAL HIGH (ref 70–99)
Potassium: 4 mmol/L (ref 3.5–5.1)
Sodium: 137 mmol/L (ref 135–145)
Total Bilirubin: 0.5 mg/dL (ref 0.3–1.2)
Total Protein: 7.6 g/dL (ref 6.5–8.1)

## 2019-05-25 LAB — URINALYSIS, ROUTINE W REFLEX MICROSCOPIC
Bilirubin Urine: NEGATIVE
Glucose, UA: NEGATIVE mg/dL
Ketones, ur: NEGATIVE mg/dL
Nitrite: POSITIVE — AB
Protein, ur: 30 mg/dL — AB
RBC / HPF: >50 RBC/hpf — ABNORMAL HIGH (ref 0–5)
Specific Gravity, Urine: 1.012 (ref 1.005–1.030)
WBC, UA: >50 WBC/hpf — ABNORMAL HIGH (ref 0–5)
pH: 6 (ref 5.0–8.0)

## 2019-05-25 LAB — LIPASE, BLOOD: Lipase: 28 U/L (ref 11–51)

## 2019-05-25 LAB — CBG MONITORING, ED: Glucose-Capillary: 114 mg/dL — ABNORMAL HIGH (ref 70–99)

## 2019-05-25 MED ORDER — ONDANSETRON HCL 4 MG/2ML IJ SOLN: 4.0000 mg | Freq: Once | INTRAMUSCULAR | Status: DC | PRN

## 2019-05-25 MED ORDER — CEPHALEXIN 500 MG PO CAPS: 500.0000 mg | ORAL_CAPSULE | Freq: Four times a day (QID) | ORAL | 0 refills | Status: AC

## 2019-05-25 MED ORDER — ACETAMINOPHEN 325 MG PO TABS: 650.0000 mg | ORAL_TABLET | Freq: Once | ORAL | Status: DC | PRN

## 2019-05-25 MED ORDER — ONDANSETRON 4 MG PO TBDP: ORAL_TABLET | ORAL | 0 refills | Status: DC

## 2019-05-25 MED ORDER — MORPHINE SULFATE (PF) 4 MG/ML IV SOLN
4.0000 mg | Freq: Once | INTRAVENOUS | Status: AC
  Administered 2019-05-25: 4 mg via INTRAVENOUS
  Filled 2019-05-25: qty 1

## 2019-05-25 MED ORDER — SODIUM CHLORIDE 0.9% FLUSH
3.0000 mL | Freq: Once | INTRAVENOUS | Status: AC
  Administered 2019-05-25: 3 mL via INTRAVENOUS

## 2019-05-25 MED ORDER — SODIUM CHLORIDE (PF) 0.9 % IJ SOLN
INTRAMUSCULAR | Status: AC
  Filled 2019-05-25: qty 50

## 2019-05-25 MED ORDER — ONDANSETRON HCL 4 MG/2ML IJ SOLN
4.0000 mg | Freq: Once | INTRAMUSCULAR | Status: AC
  Administered 2019-05-25: 4 mg via INTRAVENOUS
  Filled 2019-05-25: qty 2

## 2019-05-25 MED ORDER — SODIUM CHLORIDE 0.9 % IV SOLN
1.0000 g | Freq: Once | INTRAVENOUS | Status: AC
  Administered 2019-05-25: 10:00:00 1 g via INTRAVENOUS
  Filled 2019-05-25: qty 10

## 2019-05-25 MED ORDER — IOHEXOL 350 MG/ML SOLN: 100.0000 mL | Freq: Once | INTRAVENOUS | Status: DC | PRN

## 2019-05-25 MED ORDER — IOHEXOL 300 MG/ML  SOLN
100.0000 mL | Freq: Once | INTRAMUSCULAR | Status: AC | PRN
  Administered 2019-05-25: 100 mL via INTRAVENOUS

## 2019-05-25 MED ORDER — METOCLOPRAMIDE HCL 5 MG/ML IJ SOLN
5.0000 mg | Freq: Once | INTRAMUSCULAR | Status: AC
  Administered 2019-05-25: 5 mg via INTRAVENOUS
  Filled 2019-05-25: qty 2

## 2019-05-25 MED ORDER — SODIUM CHLORIDE 0.9 % IV BOLUS
1000.0000 mL | Freq: Once | INTRAVENOUS | Status: AC
  Administered 2019-05-25: 1000 mL via INTRAVENOUS

## 2019-05-25 NOTE — ED Triage Notes (Addendum)
Patient is complaining of a dull left quadrant pain that has been going on for a week. Patient has nausea, fever, vomiting and diarrhea.

## 2019-05-25 NOTE — ED Provider Notes (Signed)
[facility]-EMERGENCY DEPT Provider Note   CSN: [id number] Arrival date & time: 05/25/19  0636     History Chief Complaint  Patient presents with  . Abdominal Pain    [name] is a 64 y.o. female.  [name] is a 64 y.o. female with a history of hypertension, hyperlipidemia, diabetes, GERD, AVN of the right hip with chronic pain, fibromyalgia, who presents to the emergency department for evaluation of right lower quadrant abdominal pain that has been present and worsening over the past 5 days.  She reports that last Wednesday she woke up and became nauseated and had 2 episodes of vomiting but then started to feel better so did not think much of it, but starting Friday had a dull ache in her right lower quadrant that has been constant.  She has not had additional episodes of vomiting but has been nauseated with decreased appetite.  States that she had some mucousy diarrhea for about 2 days and now has not been able to have bowel movement since yesterday morning.  She states that her abdomen feels a bit distended.  She denies any dysuria or urinary frequency, does state that she thinks she has been urinating a bit less than usual.  She has not had any fevers or chills.  No pain in the chest or shortness of breath, no associated cough.  She has not taken any medications to treat her symptoms, does take chronic pain medication for her hip problems which has not helped the pain in her abdomen.  She denies any history of intra-abdominal surgeries aside from a bladder mesh which was put in place 10-12 years ago.        Past Medical History:  Diagnosis Date  . Anxiety and depression   . Asthma    related to seasonal allergies  . Avascular necrosis of hip, right (HCC) 09/27/2016  . AVN of femur (HCC) 07/30/2016  . Diabetes mellitus with complication (HCC)    microalbuminuria 03/2015  . GERD (gastroesophageal reflux disease)   . Hyperlipidemia, mixed 07/2015  .  Hypertension   . Vitamin D deficiency     Patient Active Problem List   Diagnosis Date Noted  . Trochanteric bursitis of right hip 04/02/2017  . Fibromyalgia 01/22/2017  . Carpal tunnel syndrome 12/15/2016  . Status post total replacement of right hip 09/27/2016  . Polyarthralgia 07/30/2016  . Chronic right shoulder pain 07/30/2016  . Right hand pain 07/30/2016  . Facet arthritis of lumbar region 05/20/2016  . Facet arthritis of cervical region 05/20/2016  . Primary osteoarthritis of both hips 12/18/2015  . Chronic bronchitis (HCC) 09/25/2015  . Essential hypertension 09/25/2015  . GAD (generalized anxiety disorder) 09/25/2015  . Gastroesophageal reflux disease without esophagitis 09/25/2015  . Hyperlipemia 09/25/2015  . Diabetes mellitus with complication (HCC) 09/25/2015    Past Surgical History:  Procedure Laterality Date  . FOOT SURGERY    . INCONTINENCE SURGERY    . SHOULDER SURGERY    . TONSILLECTOMY    . TOTAL HIP ARTHROPLASTY Right 09/27/2016   Procedure: RIGHT TOTAL HIP ARTHROPLASTY ANTERIOR APPROACH;  Surgeon: Kathryne Hitch, MD;  Location: [facility];  Service: Orthopedics;  Laterality: Right;     OB History   No obstetric history on file.     History reviewed. No pertinent family history.  Social History   Tobacco Use  . Smoking status: Former Smoker    Types: Cigarettes    Quit date: 04/15/2016  Years since quitting: 3.1  . Smokeless tobacco: Never Used  Substance Use Topics  . Alcohol use: Yes    Alcohol/week: 2.0 - 3.0 standard drinks    Types: 2 - 3 Glasses of wine per week    Comment: occasional  . Drug use: No    Home Medications Prior to Admission medications   Medication Sig Start Date End Date Taking? Authorizing Provider  albuterol (PROVENTIL HFA;VENTOLIN HFA) 108 (90 BASE) MCG/ACT inhaler Inhale 2 puffs into the lungs every 6 (six) hours as needed for wheezing or shortness of breath.    Yes [provider]  ALPRAZolam  Prudy Feeler) 1 MG tablet Take 1 mg by mouth 3 (three) times daily as needed for anxiety.    Yes [provider]  amLODipine (NORVASC) 10 MG tablet Take 10 mg by mouth daily.   Yes [provider]  atenolol (TENORMIN) 50 MG tablet Take 50 mg by mouth daily.   Yes [provider]  buPROPion (WELLBUTRIN SR) 100 MG 12 hr tablet TAKE ONE TABLET BY MOUTH EVERY DAY Patient taking differently: Take 100 mg by mouth daily.  07/23/17  Yes Thresa Ross, MD  fluticasone (FLONASE) 50 MCG/ACT nasal spray Place 2 sprays into both nostrils daily as needed for allergies.    Yes [provider]  gemfibrozil (LOPID) 600 MG tablet Take 1 tablet every AM and 1 tablet every PM. Patient taking differently: Take 300 mg by mouth daily.  08/20/16  Yes Andrena Mews, DO  HYDROcodone-acetaminophen (NORCO) 10-325 MG tablet Take 1-2 tablets by mouth 2 (two) times daily as needed for moderate pain.  05/10/19  Yes [provider]  losartan (COZAAR) 50 MG tablet Take 50 mg by mouth daily.  04/01/17  Yes [provider]  metFORMIN (GLUCOPHAGE) 500 MG tablet Take 500 mg by mouth daily with breakfast.    Yes [provider]  omeprazole (PRILOSEC) 20 MG capsule Take 20 mg by mouth daily.   Yes [provider]  QUEtiapine (SEROQUEL) 100 MG tablet Take 50 mg by mouth at bedtime.  03/08/19  Yes [provider]  sertraline (ZOLOFT) 100 MG tablet Take 100 mg by mouth daily. 03/23/19  Yes [provider]  TURMERIC PO Take 250 mg by mouth daily as needed (pain).   Yes [provider]  cephALEXin (KEFLEX) 500 MG capsule Take 1 capsule (500 mg total) by mouth 4 (four) times daily for 14 days. 05/25/19 06/08/19  Dartha Lodge, PA-C  cyclobenzaprine (FLEXERIL) 10 MG tablet Take 1 tablet (10 mg total) by mouth 3 (three) times daily as needed for muscle spasms. Patient not taking: Reported on 05/25/2019 11/17/17   Andrena Mews, DO  DULoxetine (CYMBALTA) 30  MG capsule Take 1 capsule (30 mg total) by mouth 2 (two) times daily. Patient not taking: Reported on 05/25/2019 01/09/18   Andrena Mews, DO  ondansetron (ZOFRAN ODT) 4 MG disintegrating tablet 4mg  ODT q4 hours prn nausea/vomit 05/25/19   07/23/19, PA-C  tiZANidine (ZANAFLEX) 4 MG tablet Take 1 tablet (4 mg total) by mouth 3 (three) times daily. Patient not taking: Reported on 05/25/2019 06/22/18 06/22/19  08/22/19, DO  escitalopram (LEXAPRO) 10 MG tablet Take 10 mg by mouth daily. 09/18/16 04/29/17  [provider]    Allergies    Patient has no known allergies.  Review of Systems   Review of Systems  Constitutional: Negative for chills and fever.  HENT: Negative.   Respiratory: Negative  for cough and shortness of breath.   Cardiovascular: Negative for chest pain.  Gastrointestinal: Positive for abdominal pain, constipation, diarrhea, nausea and vomiting. Negative for blood in stool.  Genitourinary: Positive for decreased urine volume. Negative for dysuria and frequency.  Musculoskeletal: Negative for arthralgias and myalgias.  Skin: Negative for color change and rash.  Neurological: Negative for dizziness, syncope and light-headedness.  All other systems reviewed and are negative.   Physical Exam Updated Vital Signs BP 116/73 (BP Location: Right Arm)   Pulse 99   Temp 98.8 F (37.1 C) (Oral)   Resp 18   SpO2 95%   Physical Exam Vitals and nursing note reviewed.  Constitutional:      General: She is not in acute distress.    Appearance: She is well-developed and normal weight. She is not ill-appearing or diaphoretic.  HENT:     Head: Normocephalic and atraumatic.  Eyes:     General:        Right eye: No discharge.        Left eye: No discharge.     Pupils: Pupils are equal, round, and reactive to light.  Cardiovascular:     Rate and Rhythm: Normal rate and regular rhythm.     Heart sounds: Normal heart sounds.  Pulmonary:     Effort: Pulmonary effort is  normal. No respiratory distress.     Breath sounds: Normal breath sounds. No wheezing or rales.  Abdominal:     General: Bowel sounds are normal. There is distension.     Palpations: Abdomen is soft. There is no mass.     Tenderness: There is abdominal tenderness in the right lower quadrant. There is right CVA tenderness.     Comments: Abdomen is mildly distended but soft, bowel sounds hypoactive, there is tenderness to palpation in the right lower quadrant with slight guarding, no rebound tenderness or rigidity.  Musculoskeletal:        General: No deformity.     Cervical back: Neck supple.  Skin:    General: Skin is warm and dry.     Capillary Refill: Capillary refill takes less than 2 seconds.  Neurological:     Mental Status: She is alert.     Coordination: Coordination normal.     Comments: Speech is clear, able to follow commands Moves extremities without ataxia, coordination intact  Psychiatric:        Mood and Affect: Mood normal.        Behavior: Behavior normal.     ED Results / Procedures / Treatments   Labs (all labs ordered are listed, but only abnormal results are displayed) Labs Reviewed  COMPREHENSIVE METABOLIC PANEL - Abnormal; Notable for the following components:      Result Value   Glucose, Bld 129 (*)    Calcium 8.6 (*)    All other components within normal limits  URINALYSIS, ROUTINE W REFLEX MICROSCOPIC - Abnormal; Notable for the following components:   APPearance HAZY (*)    Hgb urine dipstick MODERATE (*)    Protein, ur 30 (*)    Nitrite POSITIVE (*)    Leukocytes,Ua LARGE (*)    RBC / HPF >50 (*)    WBC, UA >50 (*)    Bacteria, UA FEW (*)    All other components within normal limits  URINE CULTURE  LIPASE, BLOOD  CBC    EKG None  Radiology CT ABDOMEN PELVIS W CONTRAST  Result Date: 05/25/2019 CLINICAL DATA:  Lower abdominal pain for  1 week, nausea, fever, vomiting, diarrhea EXAM: CT ABDOMEN AND PELVIS WITH CONTRAST TECHNIQUE:  Multidetector CT imaging of the abdomen and pelvis was performed using the standard protocol following bolus administration of intravenous contrast. CONTRAST:  100mL OMNIPAQUE IOHEXOL 300 MG/ML  SOLN COMPARISON:  07/29/2017 FINDINGS: Lower chest: No acute pleural or parenchymal lung disease. Hepatobiliary: No focal liver abnormality is seen. No gallstones, gallbladder wall thickening, or biliary dilatation. Stable mild diffuse fatty infiltration of the liver. Pancreas: Unremarkable. No pancreatic ductal dilatation or surrounding inflammatory changes. Spleen: Normal in size without focal abnormality. Adrenals/Urinary Tract: There is abnormal localized decreased enhancement upper pole right kidney, likely reflecting focal pyelonephritis. Please correlate with urinalysis. Left kidney and bilateral adrenal glands are unremarkable. No evidence of obstructive uropathy. Stomach/Bowel: There is no bowel obstruction or ileus. Scattered diverticulosis of the distal colon without acute diverticulitis. The appendix, if still present, is not well visualized. Vascular/Lymphatic: No significant vascular findings are present. No enlarged abdominal or pelvic lymph nodes. Reproductive: Uterus and bilateral adnexa are unremarkable. Other: No abdominal wall hernia or abnormality. No abdominopelvic ascites. Musculoskeletal: Postsurgical changes right hip arthroplasty. No acute bony abnormalities. IMPRESSION: 1. Localized decreased enhancement upper pole right kidney consistent with focal pyelonephritis. No evidence of renal abscess or obstruction. 2. Distal colonic diverticulosis without diverticulitis. 3. Stable mild fatty infiltration of the liver. Electronically Signed   By: Sharlet SalinaMichael  Brown M.D.   On: 05/25/2019 09:09    Procedures Procedures (including critical care time)  Medications Ordered in ED Medications  iohexol (OMNIPAQUE) 350 MG/ML injection 100 mL (has no administration in time range)  sodium chloride (PF) 0.9 %  injection (has no administration in time range)  sodium chloride (PF) 0.9 % injection (has no administration in time range)  sodium chloride flush (NS) 0.9 % injection 3 mL (3 mLs Intravenous Given 05/25/19 0702)  sodium chloride 0.9 % bolus 1,000 mL (0 mLs Intravenous Stopped 05/25/19 1003)  ondansetron (ZOFRAN) injection 4 mg (4 mg Intravenous Given 05/25/19 0740)  morphine 4 MG/ML injection 4 mg (4 mg Intravenous Given 05/25/19 0740)  iohexol (OMNIPAQUE) 300 MG/ML solution 100 mL (100 mLs Intravenous Contrast Given 05/25/19 0848)  cefTRIAXone (ROCEPHIN) 1 g in sodium chloride 0.9 % 100 mL IVPB (0 g Intravenous Stopped 05/25/19 1023)  morphine 4 MG/ML injection 4 mg (4 mg Intravenous Given 05/25/19 0939)  metoCLOPramide (REGLAN) injection 5 mg (5 mg Intravenous Given 05/25/19 [id number])    ED Course  I have reviewed the triage vital signs and the nursing notes.  Pertinent labs & imaging results that were available during my care of the patient were reviewed by me and considered in my medical decision making (see chart for details).    MDM Rules/Calculators/A&P                      Patient presents to the ED with complaints of abdominal pain. Patient nontoxic appearing, in no apparent distress, vitals WNL. On exam patient tender to palpation in the right lower quadrant, no peritoneal signs some right CVA tenderness noted as well. Will evaluate with labs and CT abdomen pelvis. Analgesics, anti-emetics, and fluids administered.   Differential includes appendicitis, diverticulitis, urinary tract infection, pyelonephritis, kidney stone, bowel obstruction/perforation.  ER work-up reviewed:  CBC: No leukocytosis, normal hemoglobin CMP: Glucose of 129, calcium of 8.6, no other significant electrolyte derangements, normal renal and liver function Lipase: Normal  UA: Concerning for infection with positive nitrites, leukocytes, greater than 50 RBCs and WBCs and  few bacteria present, culture sent Imaging: CT shows  localized decreased enhancement of the upper pole of the right kidney consistent with focal pyelonephritis, no evidence of renal abscess or obstruction diverticulosis noted but no signs of diverticulitis, appendix is not completely visualized but there is no surrounding inflammation no evidence of obstruction, stable mild fatty liver infiltration noted.  Patient's work-up shows pyelonephritis, urine culture is pending, patient has no complicating features such as obstructive uropathy or renal abscess on CT.  She is afebrile with normal vitals on arrival, no SIRS criteria.  Patient treated in the ED with IV pain and nausea medication and dose of IV Rocephin, she is now feeling much better, is tolerating p.o. fluids.  Feel patient is stable for discharge home will treat with 14-day course of Keflex, prescribe Zofran as well and encourage patient to use her home hydrocodone as needed for pain as well as Tylenol.  PCP follow-up and strict return precautions discussed.  Patient expresses understanding and agreement with plan.  Discharged home in good condition.  Final Clinical Impression(s) / ED Diagnoses Final diagnoses:  Pyelonephritis    Rx / DC Orders ED Discharge Orders         Ordered    ondansetron (ZOFRAN ODT) 4 MG disintegrating tablet     05/25/19 1126    cephALEXin (KEFLEX) 500 MG capsule  4 times daily     05/25/19 [address] [place], [place] 05/25/19 1133    Margette Fast, MD 05/26/19 [phone]

## 2019-05-25 NOTE — Discharge Instructions (Addendum)
Your CT scan and lab work showed that you have a kidney infection that is causing your pain.  Please take antibiotics 4 times daily for the next 14 days. You can use Zofran for nausea and vomiting, if you cannot keep down your antibiotics or fluids you should return to the hospital. For pain to use 650 mg of Tylenol every 6 hours, and you can use your home hydrocodone in addition to this for pain relief.    If you have worsening pain, fevers or other new or concerning symptoms please return to the ED.

## 2019-05-25 NOTE — ED Notes (Signed)
Patient ambulatory to restroom at this time to obtain urine sample. 

## 2019-05-27 LAB — URINE CULTURE: Culture: >=100000 — AB

## 2019-05-28 ENCOUNTER — Telehealth: Payer: Self-pay | Admitting: Emergency Medicine

## 2019-05-28 NOTE — Progress Notes (Addendum)
ED Antimicrobial Stewardship Positive Culture Follow Up   [name] is an 64 y.o. female who presented to [facility] on 05/25/2019 with a chief complaint of  Chief Complaint  Patient presents with  . Abdominal Pain    Recent Results (from the past 720 hour(s))  Urine culture     Status: Abnormal   Collection Time: 05/25/19  7:00 AM   Specimen: Urine, Clean Catch  Result Value Ref Range Status   Specimen Description   Final    URINE, CLEAN CATCH Performed at [facility], [address], [place], [place] [address]    Special Requests   Final    NONE Performed at [facility], [address], [place], [place] [address]    Culture >=100,000 COLONIES/mL PSEUDOMONAS AERUGINOSA (A)  Final   Report Status 05/27/2019 FINAL  Final   Organism ID, Bacteria PSEUDOMONAS AERUGINOSA (A)  Final      Susceptibility   Pseudomonas aeruginosa - MIC*    CEFTAZIDIME 2 SENSITIVE Sensitive     CIPROFLOXACIN <=0.25 SENSITIVE Sensitive     GENTAMICIN 2 SENSITIVE Sensitive     IMIPENEM 1 SENSITIVE Sensitive     PIP/TAZO <=4 SENSITIVE Sensitive     CEFEPIME 2 SENSITIVE Sensitive     * >=100,000 COLONIES/mL PSEUDOMONAS AERUGINOSA   Found to have pyelo on CT  [x]  Treated with Keflex, organism resistant to prescribed antimicrobial []  Patient discharged originally without antimicrobial agent and treatment is now indicated  New antibiotic prescription: Cipro 500 mg PO bid x 7d (#14)  ED Provider: , PA-C   [name] A 05/28/2019, 1:59 PM Clinical Pharmacist [phone]

## 2019-05-28 NOTE — Telephone Encounter (Signed)
Post ED Visit - Positive Culture Follow-up: Successful Patient Follow-Up  Culture assessed and recommendations reviewed by:  []  , Pharm.D. []  Enzo Bi, Pharm.D., BCPS AQ-ID []  , Pharm.D., BCPS []  Celedonio Miyamoto, .D., BCPS []  [place], .D., BCPS, AAHIVP []  Georgina Pillion, Pharm.D., BCPS, AAHIVP []  [address], PharmD, BCPS []  , PharmD, BCPS []  [place], PharmD, BCPS [x]  [address], PharmD  Positive urine culture  []  Patient discharged without antimicrobial prescription and treatment is now indicated [x]  Organism is resistant to prescribed ED discharge antimicrobial []  Patient with positive blood cultures  Changes discussed with ED provider: PA New antibiotic prescription: Cipro 500 mg PO BID x seven days Called to [facility]) [phone]  Contacted patient, date 05/28/2019, time 1430   [name] C [name] 05/28/2019, 3:31 PM

## 2020-01-10 ENCOUNTER — Emergency Department (INDEPENDENT_AMBULATORY_CARE_PROVIDER_SITE_OTHER): Payer: Medicare HMO

## 2020-01-10 ENCOUNTER — Emergency Department
Admission: EM | Admit: 2020-01-10 | Discharge: 2020-01-10 | Disposition: A | Discharge status: Home / Self Care | Payer: Medicare HMO | Source: Home / Self Care

## 2020-01-10 ENCOUNTER — Other Ambulatory Visit: Payer: Self-pay

## 2020-01-10 DIAGNOSIS — M25472 Effusion, left ankle: Secondary | ICD-10-CM

## 2020-01-10 DIAGNOSIS — M25572 Pain in left ankle and joints of left foot: Secondary | ICD-10-CM

## 2020-01-10 NOTE — Discharge Instructions (Signed)
  Call Sports Medicine for further evaluation and treatment of ongoing Left ankle pain and swelling.

## 2020-01-10 NOTE — ED Triage Notes (Addendum)
Pt presents to Urgent Care with c/o L ankle swelling x approximately 3 weeks. States pain began after she walked down some stairs 1 week ago. Since this time, pain and swelling have progressively gotten worse. L lateral ankle noted to be swollen. Pt has been elevating it and taking Hydrocodone that she takes regularly for chronic hip/back pain. Pt reports hx of L foot fracture in 2002.

## 2020-01-10 NOTE — ED Notes (Signed)
Pt refused crutches. States she has devices at home she would like to use.

## 2020-01-10 NOTE — ED Provider Notes (Signed)
[facility]    CSN: [id number] Arrival date & time: 01/10/20  1156      History   Chief Complaint Chief Complaint  Patient presents with  . Ankle Pain    Left, edema    HPI [name] is a 64 y.o. female.   HPI [name] is a 64 y.o. female presenting to UC with c/o 3 weeks of Left ankle swelling followed by about 1 week of ankle pain after walking down some stairs. No specific injury/  Pain is aching and sore, 8/10 with ambulation.  Swelling is worse on lateral side but pain worse on medial side.  She has taken hydrocodone prescribed for chronic hip and back pain with mild relief.  Hx of Right hip replacement a few years ago and Left foot stress fracture in 2002.     Past Medical History:  Diagnosis Date  . Anxiety and depression   . Asthma    related to seasonal allergies  . Avascular necrosis of hip, right (HCC) 09/27/2016  . AVN of femur (HCC) 07/30/2016  . Diabetes mellitus with complication (HCC)    microalbuminuria 03/2015  . GERD (gastroesophageal reflux disease)   . Hyperlipidemia, mixed 07/2015  . Hypertension   . Vitamin D deficiency     Patient Active Problem List   Diagnosis Date Noted  . Trochanteric bursitis of right hip 04/02/2017  . Fibromyalgia 01/22/2017  . Carpal tunnel syndrome 12/15/2016  . Status post total replacement of right hip 09/27/2016  . Polyarthralgia 07/30/2016  . Chronic right shoulder pain 07/30/2016  . Right hand pain 07/30/2016  . Facet arthritis of lumbar region 05/20/2016  . Facet arthritis of cervical region 05/20/2016  . Primary osteoarthritis of both hips 12/18/2015  . Chronic bronchitis (HCC) 09/25/2015  . Essential hypertension 09/25/2015  . GAD (generalized anxiety disorder) 09/25/2015  . Gastroesophageal reflux disease without esophagitis 09/25/2015  . Hyperlipemia 09/25/2015  . Diabetes mellitus with complication (HCC) 09/25/2015    Past Surgical History:  Procedure Laterality Date  . FOOT  SURGERY    . INCONTINENCE SURGERY    . SHOULDER SURGERY    . TONSILLECTOMY    . TOTAL HIP ARTHROPLASTY Right 09/27/2016   Procedure: RIGHT TOTAL HIP ARTHROPLASTY ANTERIOR APPROACH;  Surgeon: Kathryne Hitch, MD;  Location: [facility];  Service: Orthopedics;  Laterality: Right;    OB History   No obstetric history on file.      Home Medications    Prior to Admission medications   Medication Sig Start Date End Date Taking? Authorizing Provider  albuterol (PROVENTIL HFA;VENTOLIN HFA) 108 (90 BASE) MCG/ACT inhaler Inhale 2 puffs into the lungs every 6 (six) hours as needed for wheezing or shortness of breath.    Yes [provider]  ALPRAZolam Prudy Feeler) 1 MG tablet Take 1 mg by mouth 3 (three) times daily as needed for anxiety.    Yes [provider]  amLODipine (NORVASC) 10 MG tablet Take 10 mg by mouth daily.   Yes [provider]  atenolol (TENORMIN) 50 MG tablet Take 50 mg by mouth daily.   Yes [provider]  buPROPion (WELLBUTRIN SR) 100 MG 12 hr tablet TAKE ONE TABLET BY MOUTH EVERY DAY Patient taking differently: Take 100 mg by mouth daily.  07/23/17  Yes Thresa Ross, MD  DULoxetine (CYMBALTA) 30 MG capsule Take 1 capsule (30 mg total) by mouth 2 (two) times daily. 01/09/18  Yes Andrena Mews, DO  gemfibrozil (LOPID) 600  MG tablet Take 1 tablet every AM and 1 tablet every PM. Patient taking differently: Take 600 mg by mouth daily.  08/20/16  Yes Andrena Mews, DO  HYDROcodone-acetaminophen (NORCO) 10-325 MG tablet Take 1-2 tablets by mouth 2 (two) times daily as needed for moderate pain.  05/10/19  Yes [provider]  losartan (COZAAR) 50 MG tablet Take 50 mg by mouth daily.  04/01/17  Yes [provider]  metFORMIN (GLUCOPHAGE) 500 MG tablet Take 500 mg by mouth daily with breakfast.    Yes [provider]  omeprazole (PRILOSEC) 20 MG capsule Take 20 mg by mouth daily.   Yes [provider]  QUEtiapine  (SEROQUEL) 100 MG tablet Take 50 mg by mouth at bedtime.  03/08/19  Yes [provider]  sertraline (ZOLOFT) 100 MG tablet Take 100 mg by mouth daily. 03/23/19  Yes [provider]  TURMERIC PO Take 250 mg by mouth daily as needed (pain).   Yes [provider]  cyclobenzaprine (FLEXERIL) 10 MG tablet Take 1 tablet (10 mg total) by mouth 3 (three) times daily as needed for muscle spasms. Patient not taking: Reported on 05/25/2019 11/17/17   Andrena Mews, DO  fluticasone [facility]) 50 MCG/ACT nasal spray Place 2 sprays into both nostrils daily as needed for allergies.     [provider]  ondansetron (ZOFRAN ODT) 4 MG disintegrating tablet 4mg  ODT q4 hours prn nausea/vomit 05/25/19   07/23/19, PA-C  escitalopram (LEXAPRO) 10 MG tablet Take 10 mg by mouth daily. 09/18/16 04/29/17  [provider]    Family History Family History  Problem Relation Age of Onset  . Diabetes Mother   . Dementia Mother   . Dementia Father   . Parkinson's disease Father     Social History Social History   Tobacco Use  . Smoking status: Former Smoker    Types: Cigarettes    Quit date: 04/15/2016    Years since quitting: 3.7  . Smokeless tobacco: Never Used  Vaping Use  . Vaping Use: Never used  Substance Use Topics  . Alcohol use: Yes    Alcohol/week: 2.0 - 3.0 standard drinks    Types: 2 - 3 Glasses of wine per week    Comment: weekly   . Drug use: No     Allergies   Patient has no known allergies.   Review of Systems Review of Systems  Musculoskeletal: Positive for arthralgias, gait problem, joint swelling and myalgias.  Skin: Negative for color change and wound.  Neurological: Negative for weakness and numbness.     Physical Exam Triage Vital Signs ED Triage Vitals  Enc Vitals Group     BP 01/10/20 1237 (!) 148/77     Pulse Rate 01/10/20 1237 61     Resp 01/10/20 1237 20     Temp 01/10/20 1237 98 F (36.7 C)     Temp src --      SpO2  01/10/20 1237 97 %     Weight --      Height --      Head Circumference --      Peak Flow --      Pain Score 01/10/20 1227 8     Pain Loc --      Pain Edu? --      Excl. in GC? --    No data found.  Updated Vital Signs BP (!) 148/77 (BP Location: Right Arm)   Pulse 61   Temp  98 F (36.7 C)   Resp 20   SpO2 97%   Visual Acuity Right Eye Distance:   Left Eye Distance:   Bilateral Distance:    Right Eye Near:   Left Eye Near:    Bilateral Near:     Physical Exam Vitals and nursing note reviewed.  Constitutional:      Appearance: Normal appearance. She is well-developed.  HENT:     Head: Normocephalic and atraumatic.  Cardiovascular:     Rate and Rhythm: Normal rate.  Pulmonary:     Effort: Pulmonary effort is normal.  Musculoskeletal:        General: Swelling and tenderness present. Normal range of motion.     Cervical back: Normal range of motion.     Comments: Left ankle: moderate edema to lateral aspect, tenderness over medial malleolus. Full ROM. No tenderness of foot. Calf soft non-tender.  Skin:    General: Skin is warm and dry.  Neurological:     Mental Status: She is alert and oriented to person, place, and time.  Psychiatric:        Behavior: Behavior normal.      UC Treatments / Results  Labs (all labs ordered are listed, but only abnormal results are displayed) Labs Reviewed - No data to display  EKG   Radiology DG Ankle Complete Left  Result Date: 01/10/2020 CLINICAL DATA:  Pt presents to Urgent Care with c/o L ankle swelling x approximately 3 weeks. States pain began after she walked down some stairs 1 week ago. Since this time, pain and swelling have progressively gotten worse. L lateral ankle noted to be swollen. EXAM: LEFT ANKLE COMPLETE - 3+ VIEW COMPARISON:  None. FINDINGS: There is no evidence of fracture, dislocation, or joint effusion. There is no evidence of arthropathy or other focal bone abnormality. Lateral ankle soft tissue  swelling. IMPRESSION: No fracture or dislocation of the left ankle. Lateral ankle soft tissue swelling. Electronically Signed   By: Emmaline Kluver M.D.   On: 01/10/2020 13:01    Procedures Procedures (including critical care time)  Medications Ordered in UC Medications - No data to display  Initial Impression / Assessment and Plan / UC Course  I have reviewed the triage vital signs and the nursing notes.  Pertinent labs & imaging results that were available during my care of the patient were reviewed by me and considered in my medical decision making (see chart for details).     Discussed imaging with pt ASO ankle splint and crutches provided for comfort F/u with Sports Medicine, may need additional imaging if symptoms persists AVS given  Final Clinical Impressions(s) / UC Diagnoses   Final diagnoses:  Pain and swelling of left ankle     Discharge Instructions      Call Sports Medicine for further evaluation and treatment of ongoing Left ankle pain and swelling.     ED Prescriptions    None     PDMP not reviewed this encounter.   Lurene Shadow, [place] 01/10/20 1725

## 2020-08-15 ENCOUNTER — Ambulatory Visit: Payer: Medicare HMO

## 2020-08-15 ENCOUNTER — Encounter (INDEPENDENT_AMBULATORY_CARE_PROVIDER_SITE_OTHER): Payer: Medicare HMO | Admitting: Podiatry

## 2020-08-15 DIAGNOSIS — M2041 Other hammer toe(s) (acquired), right foot: Secondary | ICD-10-CM

## 2020-08-15 NOTE — Progress Notes (Signed)
This encounter was created in error - please disregard.

## 2020-08-25 ENCOUNTER — Other Ambulatory Visit: Payer: Self-pay | Admitting: Podiatry

## 2020-08-25 ENCOUNTER — Ambulatory Visit: Payer: Medicare Other | Admitting: Podiatry

## 2020-08-25 ENCOUNTER — Ambulatory Visit (INDEPENDENT_AMBULATORY_CARE_PROVIDER_SITE_OTHER): Payer: Medicare Other

## 2020-08-25 ENCOUNTER — Other Ambulatory Visit: Payer: Self-pay

## 2020-08-25 DIAGNOSIS — M2041 Other hammer toe(s) (acquired), right foot: Secondary | ICD-10-CM | POA: Diagnosis not present

## 2020-08-25 DIAGNOSIS — M2042 Other hammer toe(s) (acquired), left foot: Secondary | ICD-10-CM | POA: Diagnosis not present

## 2020-08-25 DIAGNOSIS — M2012 Hallux valgus (acquired), left foot: Secondary | ICD-10-CM

## 2020-08-25 DIAGNOSIS — M2011 Hallux valgus (acquired), right foot: Secondary | ICD-10-CM

## 2020-08-25 NOTE — Progress Notes (Signed)
  Subjective:  Patient ID: [name], female    DOB: 18-Oct-1955,  MRN: [id number]  Chief Complaint  Patient presents with  . Hammer Toe    Pt states bilateral painful hammertoes  . Nail Problem    Pt states right 1st medial ingrown nail   65 y.o. female presents with the above complaint. History confirmed with patient.   Objective:  Physical Exam: warm, good capillary refill, no trophic changes or ulcerative lesions, normal DP and PT pulses and normal sensory exam. Left Foot: Hammertoes bilaterally, reducible, Hallux interphalangeus, dorsiflexed 2nd toe Right Foot: Hammertoes bilaterally, reducible, mild HAV Hallux interphalangeus, dorsiflexed 2nd toe  No images are attached to the encounter.  Radiographs: X-ray of both feet: no fracture, dislocation, swelling or degenerative changes noted and digital contractures. Mild HAV right, left 1st metatarsal with intact hardware. Assessment:   1. Hammertoes of both feet   2. Acquired hallux valgus of both feet   3. Hallux interphalangeus, acquired, left   4. Acquired hallux interphalangeus, right    Plan:  Patient was evaluated and treated and all questions answered.  Hammertoe -XR reviewed with patient -Educated on etiology of deformity -Discussed padding and shoe gear changes -Discussed with patient that they would benefit from surgical intervention after having failed all conservative therapy.  -Patient has failed all conservative therapy and wishes to proceed with surgical intervention. All risks, benefits, and alternatives discussed with patient. No guarantees given. Consent reviewed and signed by patient. -Planned procedures: correction hammertoes 2-5 right with pin/screw fixation, correction of bunion with phalanx osteotomy   No follow-ups on file.

## 2020-09-05 ENCOUNTER — Ambulatory Visit: Payer: Medicare HMO | Admitting: Neurology

## 2020-09-06 ENCOUNTER — Telehealth: Payer: Self-pay | Admitting: Urology

## 2020-09-06 NOTE — Telephone Encounter (Signed)
DOS - 09/27/20  CORECTION BUNION BY PHALANX OSTEOTOMY RIGHT --- [id number] HAMMERTOE REPAIR 2-5 RIGHT --- [id number]   [facility] EFFECTIVE DATE - 08/20/20   PLAN DEDUCTIBLE - $0.00 W/ $0.00 REMAINING OUT OF POCKET - $4,500.00 W/ $4,435.00 REMAINING COINSURANCE - 0% COPAY - $325   PER [employer] WEB SITE FOR CPT CODES 46962 AND [phone] X'S 4 Notification or Prior Authorization is not required for the requested services  Decision ID #:[id number]

## 2020-09-13 ENCOUNTER — Telehealth: Payer: Self-pay

## 2020-09-13 NOTE — Telephone Encounter (Signed)
[name] called to cancel her surgery with Dr. Samuella Cota on 09/27/2020. She stated she wants to get a 2nd opinion first. I notified Dr. Samuella Cota and Aram Beecham with [facility]

## 2020-10-03 ENCOUNTER — Encounter: Payer: Medicare Other | Admitting: Podiatry

## 2020-10-17 ENCOUNTER — Emergency Department (HOSPITAL_BASED_OUTPATIENT_CLINIC_OR_DEPARTMENT_OTHER): Payer: Medicare Other

## 2020-10-17 ENCOUNTER — Other Ambulatory Visit: Payer: Self-pay

## 2020-10-17 ENCOUNTER — Encounter (HOSPITAL_BASED_OUTPATIENT_CLINIC_OR_DEPARTMENT_OTHER): Payer: Self-pay

## 2020-10-17 ENCOUNTER — Emergency Department (HOSPITAL_BASED_OUTPATIENT_CLINIC_OR_DEPARTMENT_OTHER)
Admission: EM | Admit: 2020-10-17 | Discharge: 2020-10-17 | Disposition: A | Discharge status: Home / Self Care | Payer: Medicare Other | Attending: Emergency Medicine | Admitting: Emergency Medicine

## 2020-10-17 ENCOUNTER — Encounter: Payer: Medicare Other | Admitting: Podiatry

## 2020-10-17 DIAGNOSIS — Z7984 Long term (current) use of oral hypoglycemic drugs: Secondary | ICD-10-CM | POA: Diagnosis not present

## 2020-10-17 DIAGNOSIS — I10 Essential (primary) hypertension: Secondary | ICD-10-CM | POA: Diagnosis not present

## 2020-10-17 DIAGNOSIS — Y9389 Activity, other specified: Secondary | ICD-10-CM | POA: Diagnosis not present

## 2020-10-17 DIAGNOSIS — E119 Type 2 diabetes mellitus without complications: Secondary | ICD-10-CM | POA: Insufficient documentation

## 2020-10-17 DIAGNOSIS — Z96641 Presence of right artificial hip joint: Secondary | ICD-10-CM | POA: Diagnosis not present

## 2020-10-17 DIAGNOSIS — S61215A Laceration without foreign body of left ring finger without damage to nail, initial encounter: Secondary | ICD-10-CM | POA: Diagnosis present

## 2020-10-17 DIAGNOSIS — Z23 Encounter for immunization: Secondary | ICD-10-CM | POA: Insufficient documentation

## 2020-10-17 DIAGNOSIS — J45909 Unspecified asthma, uncomplicated: Secondary | ICD-10-CM | POA: Diagnosis not present

## 2020-10-17 DIAGNOSIS — Z87891 Personal history of nicotine dependence: Secondary | ICD-10-CM | POA: Insufficient documentation

## 2020-10-17 DIAGNOSIS — Z79899 Other long term (current) drug therapy: Secondary | ICD-10-CM | POA: Insufficient documentation

## 2020-10-17 DIAGNOSIS — W278XXA Contact with other nonpowered hand tool, initial encounter: Secondary | ICD-10-CM | POA: Insufficient documentation

## 2020-10-17 MED ORDER — LIDOCAINE HCL (PF) 1 % IJ SOLN
INTRAMUSCULAR | Status: AC
  Administered 2020-10-17: 5 mL
  Filled 2020-10-17: qty 5

## 2020-10-17 MED ORDER — TETANUS-DIPHTH-ACELL PERTUSSIS 5-2.5-18.5 LF-MCG/0.5 IM SUSY
0.5000 mL | PREFILLED_SYRINGE | Freq: Once | INTRAMUSCULAR | Status: AC
  Administered 2020-10-17: 0.5 mL via INTRAMUSCULAR
  Filled 2020-10-17: qty 0.5

## 2020-10-17 MED ORDER — LIDOCAINE HCL (PF) 1 % IJ SOLN
30.0000 mL | Freq: Once | INTRAMUSCULAR | Status: AC
  Administered 2020-10-17: 30 mL
  Filled 2020-10-17: qty 30

## 2020-10-17 MED ORDER — CEPHALEXIN 500 MG PO CAPS: 500.0000 mg | ORAL_CAPSULE | Freq: Three times a day (TID) | ORAL | 0 refills | Status: DC

## 2020-10-17 MED ORDER — CEPHALEXIN 250 MG PO CAPS
500.0000 mg | ORAL_CAPSULE | Freq: Once | ORAL | Status: AC
  Administered 2020-10-17: 500 mg via ORAL
  Filled 2020-10-17: qty 2

## 2020-10-17 NOTE — ED Notes (Signed)
ED Provider at bedside. 

## 2020-10-17 NOTE — ED Provider Notes (Addendum)
[facility]   CSN: [id number] Arrival date & time: 10/17/20  0112     History Chief Complaint  Patient presents with   Finger Injury    Left Fourth    [name] is a 65 y.o. female.  Patient with laceration to the left ring finger tonight about 1 hour ago.  States she was trying to hammer a banner in the dark while intoxicated.  She missed the nail and hit her finger with a hammer.  States she had too much to drink and was trying to hang a banner in the dark.  No focal weakness, numbness or tingling. Unknown last tetanus. No other injury  The history is provided by the patient.      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   Severe episode of recurrent major depressive disorder, without psychotic features (HCC) 05/06/2018   Trochanteric bursitis of right hip 04/02/2017   Fibromyalgia 01/22/2017   Carpal tunnel syndrome 12/15/2016   Status post total replacement of right hip 09/27/2016   Polyarthralgia 07/30/2016   Chronic right shoulder pain 07/30/2016   Right hand pain 07/30/2016   Facet arthritis of lumbar region 05/20/2016   Facet arthritis of cervical region 05/20/2016   Primary osteoarthritis of both hips 12/18/2015   Chronic bronchitis (HCC) 09/25/2015   Essential hypertension 09/25/2015   GAD (generalized anxiety disorder) 09/25/2015   Gastroesophageal reflux disease without esophagitis 09/25/2015   Hyperlipemia 09/25/2015   Diabetes mellitus with  complication (HCC) 09/25/2015    Past Surgical History:  Procedure Laterality Date   FOOT SURGERY     INCONTINENCE SURGERY     SHOULDER SURGERY     TONSILLECTOMY     TOTAL HIP ARTHROPLASTY Right 09/27/2016   Procedure: RIGHT TOTAL HIP ARTHROPLASTY ANTERIOR APPROACH;  Surgeon: Kathryne HitchBlackman, Christopher Y, MD;  Location: [facility];  Service: Orthopedics;  Laterality: Right;     OB History   No obstetric history on file.     Family History  Problem Relation Age of Onset   Diabetes Mother    Dementia Mother    Dementia Father    Parkinson's disease Father     Social History   Tobacco Use   Smoking status: Former    Pack years: 0.00    Types: Cigarettes    Quit date: 04/15/2016    Years since quitting: 4.5   Smokeless tobacco: Never  Vaping Use   Vaping Use: Never used  Substance Use Topics   Alcohol use: Yes    Alcohol/week: 2.0 - 3.0 standard drinks    Types: 2 - 3 Glasses of wine per week    Comment: weekly    Drug use: No    Home Medications Prior to Admission medications   Medication Sig Start Date End Date Taking? Authorizing Provider  albuterol (PROVENTIL HFA;VENTOLIN HFA) 108 (90 BASE) MCG/ACT inhaler Inhale 2 puffs into the lungs every 6 (six) hours as needed for wheezing or shortness of  breath.     [provider]  ALPRAZolam Prudy Feeler) 1 MG tablet Take 1 mg by mouth 3 (three) times daily as needed for anxiety.     [provider]  amLODipine (NORVASC) 10 MG tablet Take 10 mg by mouth daily.    [provider]  atenolol (TENORMIN) 50 MG tablet Take 50 mg by mouth daily.    [provider]  atorvastatin (LIPITOR) 10 MG tablet Take by mouth.    [provider]  benzonatate (TESSALON) 100 MG capsule Take 100 mg by mouth 3 (three) times daily as needed. 04/06/20   [provider]  buPROPion (WELLBUTRIN SR) 100 MG 12 hr tablet TAKE ONE TABLET BY MOUTH EVERY DAY Patient taking differently: Take 100 mg by mouth daily.  07/23/17   Thresa Ross, MD  busPIRone (BUSPAR) 10 MG tablet Take by mouth.    [provider]  Cholecalciferol 250 MCG (10000 UT) CAPS Take by mouth.    [provider]  cyclobenzaprine (FLEXERIL) 10 MG tablet Take 1 tablet (10 mg total) by mouth 3 (three) times daily as needed for muscle spasms. 11/17/17   Andrena Mews, DO  cycloSPORINE (RESTASIS) 0.05 % ophthalmic emulsion Place 1 drop into both eyes 2 (two) times daily.    [provider]  DHEA 50 MG CAPS Take 1 capsule by mouth daily.    [provider]  doxycycline (VIBRA-TABS) 100 MG tablet Take 100 mg by mouth 2 (two) times daily. 03/30/20   [provider]  DULoxetine (CYMBALTA) 30 MG capsule Take 1 capsule (30 mg total) by mouth 2 (two) times daily. 01/09/18   Andrena Mews, DO  empagliflozin (JARDIANCE) 10 MG TABS tablet Take by mouth.    [provider]  fenofibrate micronized (LOFIBRA) 67 MG capsule Take by mouth.    [provider]  fexofenadine (ALLEGRA) 180 MG tablet Take by mouth.    [provider]  fluticasone (FLONASE) 50 MCG/ACT nasal spray Place 2 sprays into both nostrils daily as needed for allergies.     [provider]  gemfibrozil (LOPID) 600 MG tablet Take 1 tablet every AM and 1 tablet every PM. Patient taking differently: Take 600 mg by mouth daily. 08/20/16   Andrena Mews, DO  HYDROcodone-acetaminophen (NORCO) 10-325 MG tablet Take 1-2 tablets by mouth 2 (two) times daily as needed for moderate pain.  05/10/19   [provider]  levofloxacin (LEVAQUIN) 250 MG tablet Take 125 mg by mouth 2 (two) times daily. 04/17/20   [provider]  losartan (COZAAR) 50 MG tablet Take 50 mg by mouth daily.  04/01/17   [provider]  metFORMIN (GLUCOPHAGE) 500 MG tablet Take 500 mg by mouth daily with breakfast.     [provider]  naloxone [facility]) nasal spray 4 mg/0.1 mL  07/15/19   [provider]   omeprazole (PRILOSEC) 20 MG capsule Take 20 mg by mouth daily.    [provider]  ondansetron (ZOFRAN ODT) 4 MG disintegrating tablet 4mg  ODT q4 hours prn nausea/vomit 05/25/19   07/23/19, PA-C  promethazine-dextromethorphan (PROMETHAZINE-DM) 6.25-15 MG/5ML syrup Take 5 mLs by mouth 4 (four) times daily as needed. 04/06/20   [provider]  QUEtiapine (SEROQUEL) 100 MG tablet Take 50 mg by mouth at bedtime.  03/08/19   [provider]  RYBELSUS 3 MG TABS Take 1 tablet by mouth daily. 04/26/20   [provider]  sertraline (ZOLOFT) 100 MG tablet  Take 100 mg by mouth daily. 03/23/19   [provider]  triamcinolone cream (KENALOG) 0.1 % Apply topically 2 (two) times daily as needed. 04/17/20   [provider]  TURMERIC PO Take 250 mg by mouth daily as needed (pain).    [provider]  vitamin A 3 MG (10000 UNITS) capsule Take by mouth.    [provider]  Vitamin D, Ergocalciferol, (DRISDOL) 1.25 MG (50000 UNIT) CAPS capsule Take 1 capsule by mouth once a week. 07/23/20   [provider]  zolpidem (AMBIEN) 10 MG tablet Take by mouth.    [provider]  escitalopram (LEXAPRO) 10 MG tablet Take 10 mg by mouth daily. 09/18/16 04/29/17  [provider]    Allergies    Semaglutide  Review of Systems   Review of Systems  Constitutional:  Negative for activity change, appetite change and fever.  HENT:  Negative for congestion.   Eyes:  Negative for photophobia.  Respiratory:  Negative for cough, chest tightness and shortness of breath.   Cardiovascular:  Negative for chest pain.  Gastrointestinal:  Negative for abdominal distention, nausea and vomiting.  Genitourinary:  Negative for dysuria and hematuria.  Musculoskeletal:  Negative for arthralgias and myalgias.  Skin:  Positive for wound.  Neurological:  Negative for dizziness, weakness and headaches.   all other systems are negative except as  noted in the HPI and PMH.   Physical Exam Updated Vital Signs BP 132/72 (BP Location: Right Arm)   Pulse 63   Temp 97.6 F (36.4 C) (Oral)   Resp 16   Ht 5\' 10"  (1.778 m)   Wt 83.9 kg   SpO2 97%   BMI 26.54 kg/m   Physical Exam Vitals and nursing note reviewed.  Constitutional:      General: She is not in acute distress.    Appearance: She is well-developed.  HENT:     Head: Normocephalic and atraumatic.     Mouth/Throat:     Pharynx: No oropharyngeal exudate.  Eyes:     Conjunctiva/sclera: Conjunctivae normal.     Pupils: Pupils are equal, round, and reactive to light.  Neck:     Comments: No meningismus. Cardiovascular:     Rate and Rhythm: Normal rate and regular rhythm.     Heart sounds: Normal heart sounds. No murmur heard. Pulmonary:     Effort: Pulmonary effort is normal. No respiratory distress.     Breath sounds: Normal breath sounds.  Abdominal:     Palpations: Abdomen is soft.     Tenderness: There is no abdominal tenderness. There is no guarding or rebound.  Musculoskeletal:        General: Tenderness and signs of injury present. Normal range of motion.     Cervical back: Normal range of motion and neck supple.     Comments: 2 centimeter laceration to tip of left fourth finger.  Hemostatic.  Not involving nailbed. Flexion and extension intact at PIP, MCP and DIP joints  Skin:    General: Skin is warm.  Neurological:     Mental Status: She is alert and oriented to person, place, and time.     Cranial Nerves: No cranial nerve deficit.     Motor: No abnormal muscle tone.     Coordination: Coordination normal.     Comments: No ataxia on finger to nose bilaterally. No pronator drift. 5/5 strength throughout. CN 2-12 intact.Equal grip strength. Sensation intact.   Psychiatric:  Behavior: Behavior normal.    ED Results / Procedures / Treatments   Labs (all labs ordered are listed, but only abnormal results are displayed) Labs Reviewed - No data to  display  EKG None  Radiology DG Finger Ring Left  Result Date: 10/17/2020 CLINICAL DATA:  Left ring finger laceration EXAM: LEFT RING FINGER 2+V COMPARISON:  None. FINDINGS: Three view radiograph left ring finger demonstrates a soft tissue defect subjacent to the distal tuft of the ring finger in keeping with the given history of laceration. There is a age indeterminate minimally displaced tiny intra-articular fracture of the ulnar base of the distal phalanx comprising less than 10% of the articular surface. No articular incongruity. No other fracture or dislocation identified. Joint spaces are preserved. IMPRESSION: Soft tissue defect in keeping with history of laceration involving the distal aspect of the ring finger. No retained radiopaque foreign body. Age determinate tiny intra-articular aligned fracture of the distal phalanx as described above. Electronically Signed   By: Helyn Numbers MD   On: 10/17/2020 01:59    Procedures .Marland KitchenLaceration Repair  Date/Time: 10/17/2020 2:46 AM Performed by: Glynn Octave, MD Authorized by: Glynn Octave, MD   Consent:    Consent obtained:  Verbal   Consent given by:  Patient   Risks, benefits, and alternatives were discussed: yes     Risks discussed:  Need for additional repair, nerve damage, poor wound healing, poor cosmetic result, pain, retained foreign body, tendon damage, vascular damage and infection   Alternatives discussed:  No treatment Universal protocol:    Procedure explained and questions answered to patient or proxy's satisfaction: yes     Imaging studies available: yes     Patient identity confirmed:  Hospital-assigned identification number Anesthesia:    Anesthesia method:  Nerve block   Block location:  Finger   Block needle gauge:  25 G   Block anesthetic:  Lidocaine 1% w/o epi   Block technique:  Digital   Block injection procedure:  Anatomic landmarks identified, anatomic landmarks palpated, negative aspiration for  blood, introduced needle and incremental injection   Block outcome:  Anesthesia achieved Laceration details:    Location:  Finger   Finger location:  L ring finger   Length (cm):  2 Pre-procedure details:    Preparation:  Patient was prepped and draped in usual sterile fashion and imaging obtained to evaluate for foreign bodies Exploration:    Limited defect created (wound extended): no     Hemostasis achieved with:  Direct pressure   Imaging outcome: foreign body not noted     Wound exploration: wound explored through full range of motion and entire depth of wound visualized     Wound extent: underlying fracture and vascular damage   Treatment:    Area cleansed with:  Povidone-iodine   Amount of cleaning:  Standard   Irrigation solution:  Sterile saline   Irrigation method:  Syringe   Debridement:  None   Undermining:  None Skin repair:    Repair method:  Sutures   Suture size:  6-0   Suture material:  Prolene   Suture technique:  Simple interrupted   Number of sutures:  5 Approximation:    Approximation:  Close Repair type:    Repair type:  Simple Post-procedure details:    Dressing:  Antibiotic ointment, adhesive bandage and splint for protection   Procedure completion:  Tolerated   Medications Ordered in ED Medications  lidocaine (PF) (XYLOCAINE) 1 % injection 30 mL (has no  administration in time range)  Tdap (BOOSTRIX) injection 0.5 mL (has no administration in time range)    ED Course  I have reviewed the triage vital signs and the nursing notes.  Pertinent labs & imaging results that were available during my care of the patient were reviewed by me and considered in my medical decision making (see chart for details).    MDM Rules/Calculators/A&P                         Finger laceration, neurovascular intact.  Will x-ray and update tetanus  X-ray concerning for tiny intra-articular fracture of distal phalanx of fourth digit.  Laceration repaired as above.   Will prescribe empiric antibiotics and will place splint, follow-up with hand surgery.  Suture removal in 7 days.  Discussed with Dr. Merlyn Lot with hand surgery.  Unclear if this wound communicates with small fracture site of distal phalanx.  He agrees with wound closure, antibiotics, and immobilization as done. Follow-up with PCP as well as hand surgery.  Suture removal in 7 days.  Return precautions discussed. Final Clinical Impression(s) / ED Diagnoses Final diagnoses:  Laceration of left ring finger without foreign body without damage to nail, initial encounter    Rx / DC Orders ED Discharge Orders          Ordered    cephALEXin (KEFLEX) 500 MG capsule  3 times daily        10/17/20 0326             Glynn Octave, MD 10/17/20 0330    Glynn Octave, MD 10/17/20 [phone]

## 2020-10-17 NOTE — ED Triage Notes (Signed)
Patient here POV from Home with Finger Laceration.  Patient has approximately 2-3 cm laceration to Distal Fourth Finger on Left Hand.  Patient unsure exactly of how injury occurred but states a hammer and nail were involved.   Ambulatory, GCS 15.

## 2020-10-17 NOTE — Discharge Instructions (Addendum)
Keep wound clean and dry for 24 hours.  Follow-up with Dr. Merlyn Lot and take the antibiotics as prescribed.  Sutures should be removed in 7 days.  Return to the ED with new or worsening symptoms.

## 2020-10-17 NOTE — ED Notes (Signed)
Wound dressing done.

## 2021-02-04 IMAGING — DX DG ANKLE COMPLETE 3+V*L*
3 series · 3 of 3 positions shown · non-contrast
Comparison: None.

CLINICAL DATA: Pt presents to [HOSPITAL] with c/o L ankle swelling
x approximately 3 weeks. States pain began after she walked down
some stairs 1 week ago. Since this time, pain and swelling have
progressively gotten worse. L lateral ankle noted to be swollen.

EXAM:
LEFT ANKLE COMPLETE - 3+ VIEW

[ankle ap]
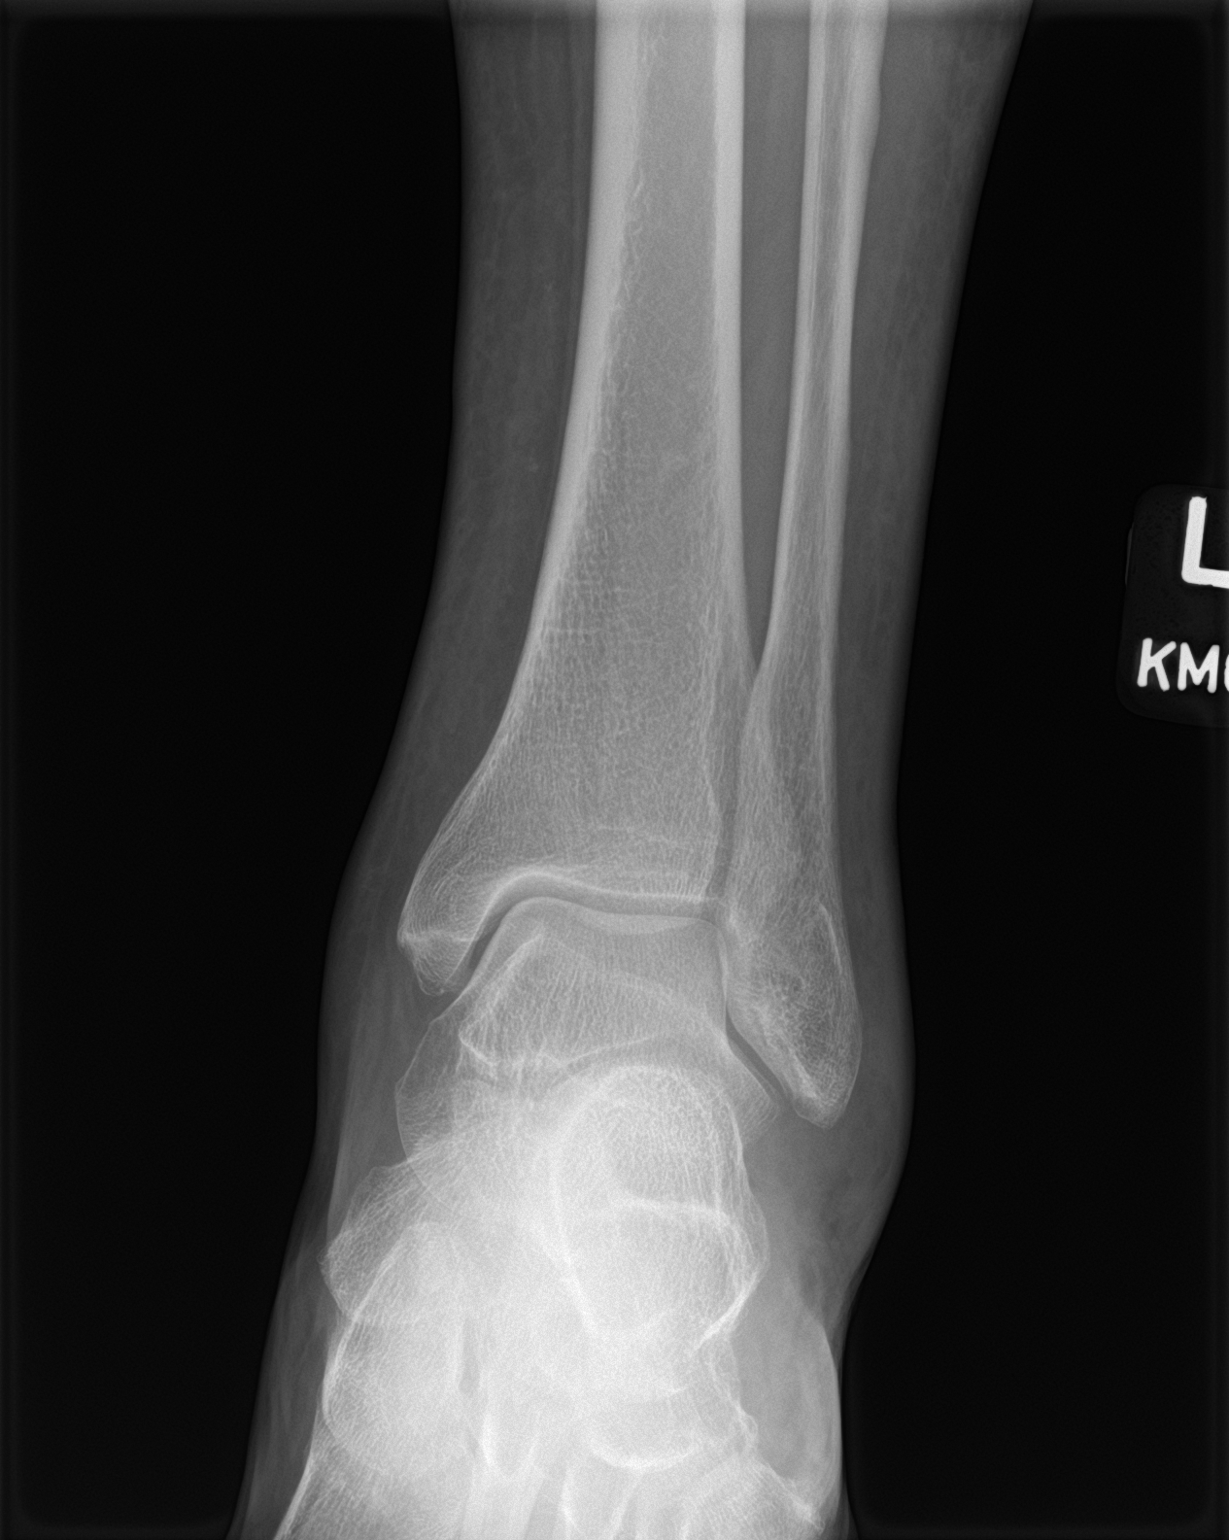

[ankle obl]
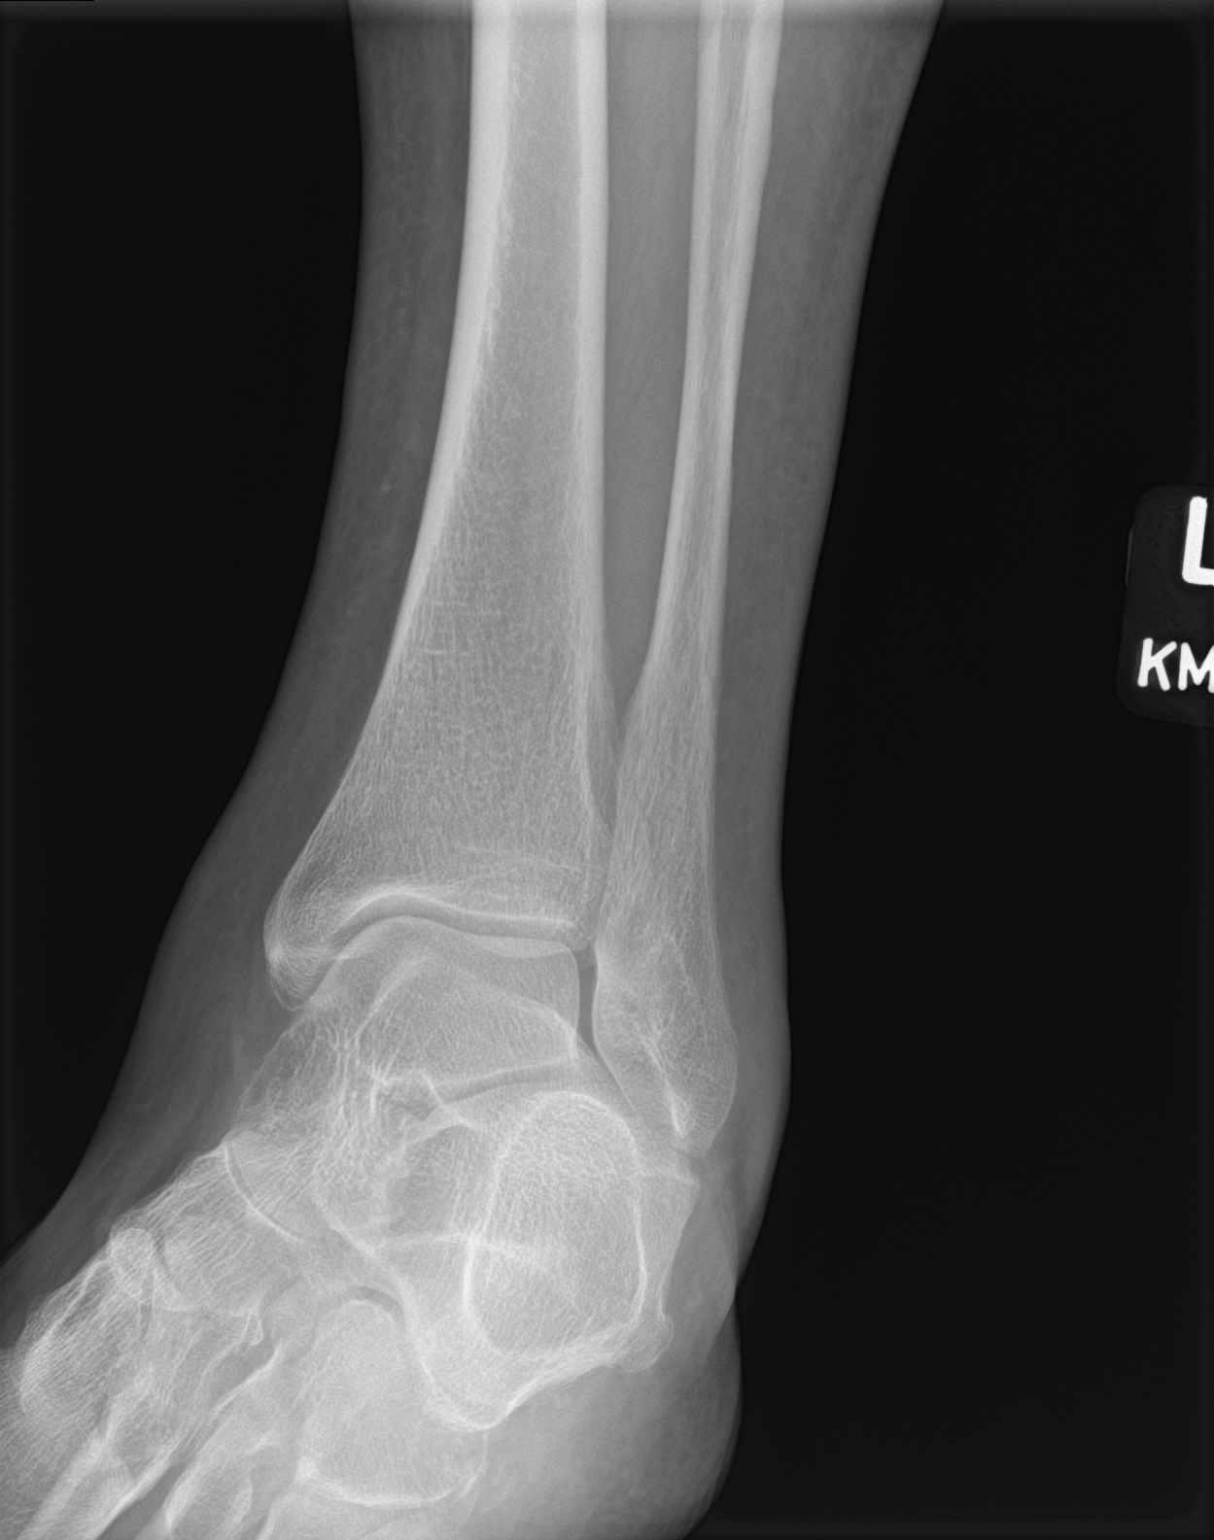

[ankle lat]
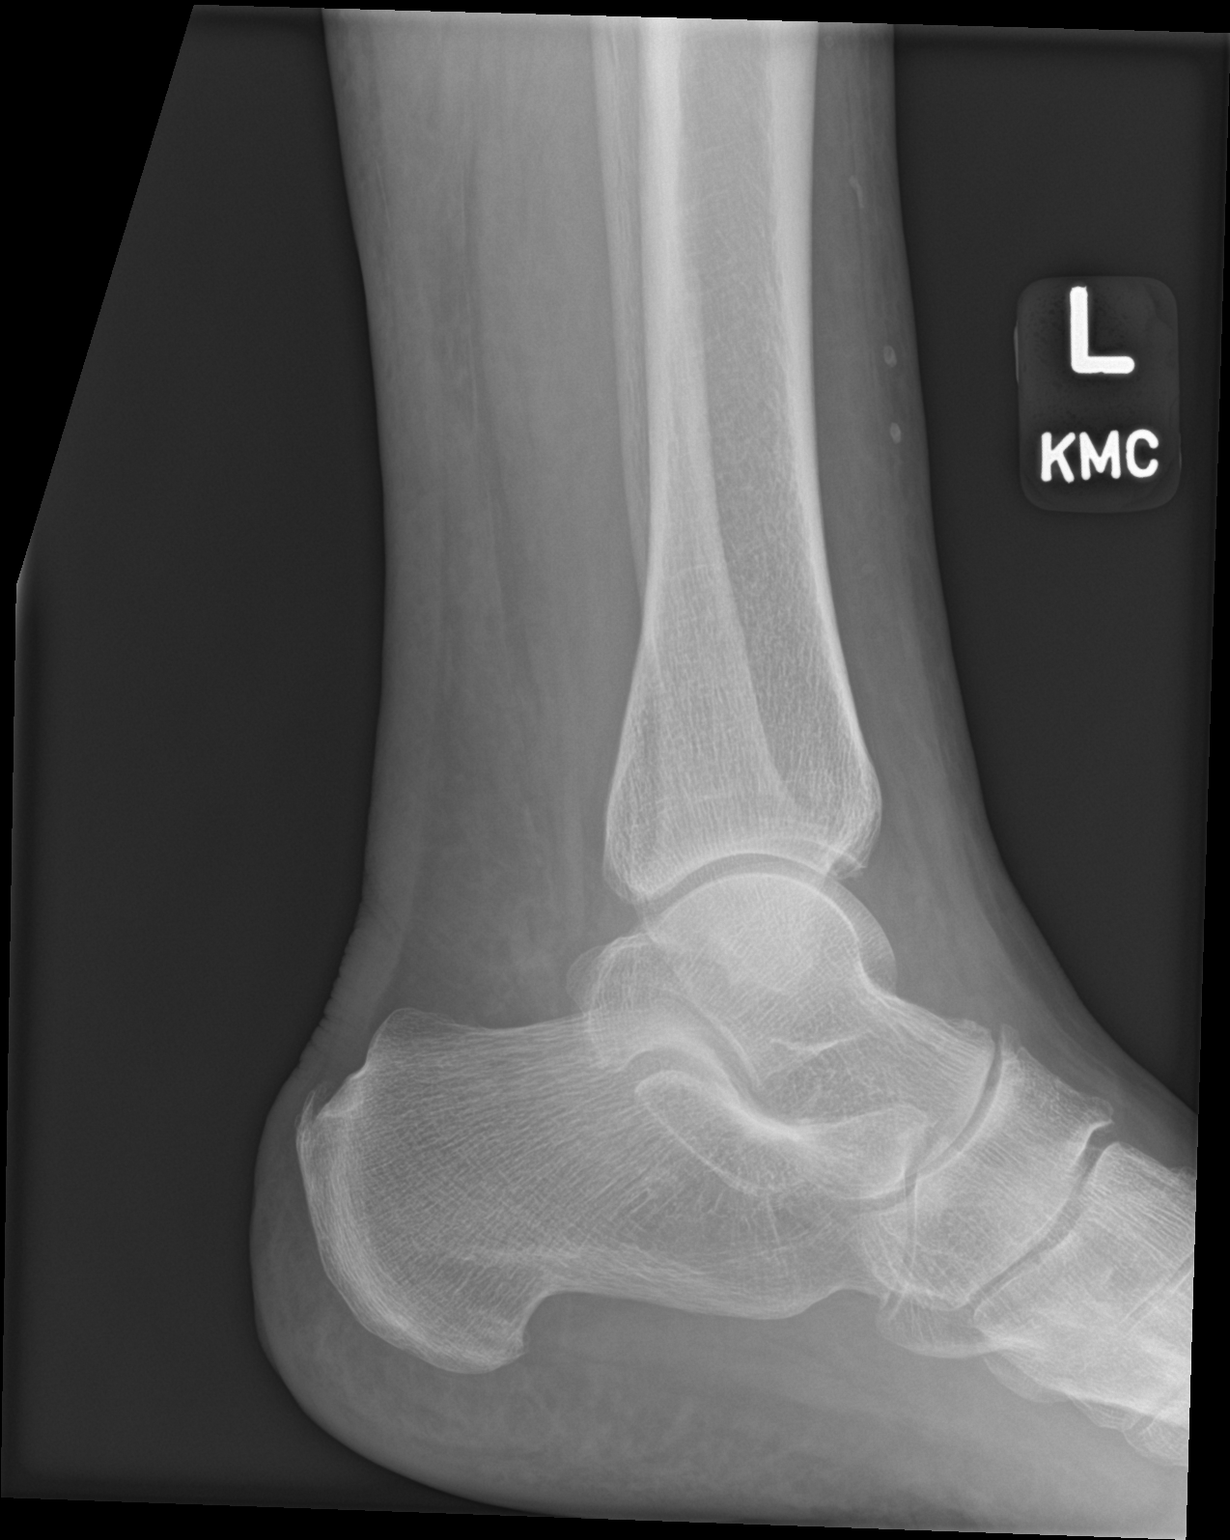

[3 of 3 positions shown; findings below may reference images not displayed]

FINDINGS: There is no evidence of fracture, dislocation, or joint effusion.
There is no evidence of arthropathy or other focal bone abnormality.
Lateral ankle soft tissue swelling.
IMPRESSION: No fracture or dislocation of the left ankle. Lateral ankle soft
tissue swelling.

## 2021-04-27 ENCOUNTER — Other Ambulatory Visit: Payer: Self-pay

## 2021-04-27 ENCOUNTER — Emergency Department (HOSPITAL_BASED_OUTPATIENT_CLINIC_OR_DEPARTMENT_OTHER): Payer: Medicare Other | Admitting: Radiology

## 2021-04-27 ENCOUNTER — Emergency Department (HOSPITAL_BASED_OUTPATIENT_CLINIC_OR_DEPARTMENT_OTHER)
Admission: EM | Admit: 2021-04-27 | Discharge: 2021-04-27 | Disposition: A | Discharge status: Home / Self Care | Payer: Medicare Other | Attending: Emergency Medicine | Admitting: Emergency Medicine

## 2021-04-27 DIAGNOSIS — R059 Cough, unspecified: Secondary | ICD-10-CM | POA: Insufficient documentation

## 2021-04-27 DIAGNOSIS — Z20822 Contact with and (suspected) exposure to covid-19: Secondary | ICD-10-CM | POA: Diagnosis not present

## 2021-04-27 DIAGNOSIS — Z5321 Procedure and treatment not carried out due to patient leaving prior to being seen by health care provider: Secondary | ICD-10-CM | POA: Diagnosis not present

## 2021-04-27 DIAGNOSIS — R0602 Shortness of breath: Secondary | ICD-10-CM | POA: Diagnosis present

## 2021-04-27 LAB — RESP PANEL BY RT-PCR (FLU A&B, COVID) ARPGX2
Influenza A by PCR: NEGATIVE
Influenza B by PCR: NEGATIVE
SARS Coronavirus 2 by RT PCR: NEGATIVE

## 2021-04-27 MED ORDER — ALBUTEROL SULFATE (2.5 MG/3ML) 0.083% IN NEBU: 2.5000 mg | INHALATION_SOLUTION | RESPIRATORY_TRACT | Status: DC | PRN

## 2021-04-27 MED ORDER — IPRATROPIUM-ALBUTEROL 0.5-2.5 (3) MG/3ML IN SOLN: 3.0000 mL | Freq: Four times a day (QID) | RESPIRATORY_TRACT | Status: DC | PRN

## 2021-04-27 NOTE — ED Notes (Signed)
Pt called to vs no answer.

## 2021-04-27 NOTE — ED Triage Notes (Signed)
Patient reports to the ER for cough. Patient reports she was sent here for a chest x-ray to rule out pneumonia.

## 2022-02-12 ENCOUNTER — Encounter (HOSPITAL_BASED_OUTPATIENT_CLINIC_OR_DEPARTMENT_OTHER): Payer: Self-pay

## 2022-02-12 ENCOUNTER — Emergency Department (HOSPITAL_BASED_OUTPATIENT_CLINIC_OR_DEPARTMENT_OTHER)
Admission: EM | Admit: 2022-02-12 | Discharge: 2022-02-12 | Disposition: A | Discharge status: Home / Self Care | Payer: Medicare Other | Attending: Emergency Medicine | Admitting: Emergency Medicine

## 2022-02-12 ENCOUNTER — Other Ambulatory Visit (HOSPITAL_BASED_OUTPATIENT_CLINIC_OR_DEPARTMENT_OTHER): Payer: Self-pay

## 2022-02-12 ENCOUNTER — Other Ambulatory Visit: Payer: Self-pay

## 2022-02-12 ENCOUNTER — Emergency Department (HOSPITAL_BASED_OUTPATIENT_CLINIC_OR_DEPARTMENT_OTHER): Payer: Medicare Other

## 2022-02-12 DIAGNOSIS — M5442 Lumbago with sciatica, left side: Secondary | ICD-10-CM | POA: Diagnosis not present

## 2022-02-12 DIAGNOSIS — R109 Unspecified abdominal pain: Secondary | ICD-10-CM | POA: Diagnosis not present

## 2022-02-12 DIAGNOSIS — E119 Type 2 diabetes mellitus without complications: Secondary | ICD-10-CM | POA: Insufficient documentation

## 2022-02-12 DIAGNOSIS — M25552 Pain in left hip: Secondary | ICD-10-CM

## 2022-02-12 DIAGNOSIS — I10 Essential (primary) hypertension: Secondary | ICD-10-CM | POA: Diagnosis not present

## 2022-02-12 DIAGNOSIS — R8271 Bacteriuria: Secondary | ICD-10-CM | POA: Diagnosis not present

## 2022-02-12 DIAGNOSIS — M545 Low back pain, unspecified: Secondary | ICD-10-CM | POA: Diagnosis present

## 2022-02-12 DIAGNOSIS — J45909 Unspecified asthma, uncomplicated: Secondary | ICD-10-CM | POA: Diagnosis not present

## 2022-02-12 LAB — URINALYSIS, ROUTINE W REFLEX MICROSCOPIC
Bilirubin Urine: NEGATIVE
Glucose, UA: NEGATIVE mg/dL
Hgb urine dipstick: NEGATIVE
Ketones, ur: NEGATIVE mg/dL
Nitrite: NEGATIVE
Protein, ur: NEGATIVE mg/dL
Specific Gravity, Urine: <1.005 — ABNORMAL LOW (ref 1.005–1.030)
pH: 5.5 (ref 5.0–8.0)

## 2022-02-12 MED ORDER — LIDOCAINE 5 % EX OINT
1.0000 | TOPICAL_OINTMENT | CUTANEOUS | 0 refills | Status: DC | PRN
  Filled 2022-02-12: qty 35.44, 30d supply, fill #0

## 2022-02-12 MED ORDER — KETOROLAC TROMETHAMINE 30 MG/ML IJ SOLN
30.0000 mg | Freq: Once | INTRAMUSCULAR | Status: AC
  Administered 2022-02-12: 30 mg via INTRAMUSCULAR
  Filled 2022-02-12: qty 1

## 2022-02-12 NOTE — ED Provider Notes (Signed)
Emergency Department Provider Note   I have reviewed the triage vital signs and the nursing notes.   HISTORY  Chief Complaint Back Pain   HPI [name] is a 66 y.o. female with past history reviewed below presents to the emergency department for evaluation of lower back and left hip pain.  Symptoms been ongoing for the past 1 to 2 weeks.  She has been compliant with her home medications including medicines she has been given by her pain management team at  Hospital.  She feels that the Vicodin is less effective and is having some side effects from other medications.  She is scheduled to see orthopedics in the next 2 to 3 weeks but continues to have discomfort.  No injuries.  No fevers.  No IVDA.   Past Medical History:  Diagnosis Date   Anxiety and depression    Asthma    related to seasonal allergies   Avascular necrosis of hip, right ([place]) 09/27/2016   AVN of femur ([place]) 07/30/2016   Diabetes mellitus with complication ([place])    microalbuminuria 03/2015   GERD (gastroesophageal reflux disease)    Hyperlipidemia, mixed 07/2015   Hypertension    Vitamin D deficiency     Review of Systems  Constitutional: No fever/chills Cardiovascular: Denies chest pain. Respiratory: Denies shortness of breath. Gastrointestinal: No abdominal pain. Musculoskeletal: Positive for back pain and left hip pain.  Skin: Negative for rash. Neurological: Negative for headaches. No weakness/numbness.    ____________________________________________   PHYSICAL EXAM:  VITAL SIGNS: ED Triage Vitals  Enc Vitals Group     BP 02/12/22 1353 (!) 184/77     Pulse Rate 02/12/22 1353 66     Resp 02/12/22 1353 20     Temp 02/12/22 1353 98 F (36.7 C)     Temp src --      SpO2 02/12/22 1353 100 %     Weight 02/12/22 1352 184 lb 15.5 oz (83.9 kg)     Height 02/12/22 1352 5\' 10"  (1.778 m)   Constitutional: Alert and oriented. Well appearing and in no acute distress. Eyes: Conjunctivae are  normal. Head: Atraumatic. Nose: No congestion/rhinnorhea. Mouth/Throat: Mucous membranes are moist.  Neck: No stridor. Cardiovascular: Normal rate, regular rhythm. Good peripheral circulation. Grossly normal heart sounds.   Respiratory: Normal respiratory effort.  No retractions. Lungs CTAB. Gastrointestinal: Soft and nontender. No distention.  Musculoskeletal: No lower extremity tenderness nor edema. No gross deformities of extremities. Normal ROM of the left hip pain grimacing with movement. No midline lumbar spine tenderness.  Neurologic:  Normal speech and language. No gross focal neurologic deficits are appreciated.  Skin:  Skin is warm, dry and intact. No rash noted.  ____________________________________________   LABS (all labs ordered are listed, but only abnormal results are displayed)  Labs Reviewed  URINE CULTURE - Abnormal; Notable for the following components:      Result Value   Culture 60,000 COLONIES/mL KLEBSIELLA PNEUMONIAE (*)    Organism ID, Bacteria KLEBSIELLA PNEUMONIAE (*)    All other components within normal limits  URINALYSIS, ROUTINE W REFLEX MICROSCOPIC - Abnormal; Notable for the following components:   Color, Urine COLORLESS (*)    Specific Gravity, Urine <1.005 (*)    Leukocytes,Ua TRACE (*)    Bacteria, UA MANY (*)    All other components within normal limits   ____________________________________________  RADIOLOGY  No results found.  ____________________________________________   PROCEDURES  Procedure(s) performed:   Procedures  None  ____________________________________________  INITIAL IMPRESSION / ASSESSMENT AND PLAN / ED COURSE  Pertinent labs & imaging results that were available during my care of the patient were reviewed by me and considered in my medical decision making (see chart for details).   This patient is Presenting for Evaluation of back pain, which does require a range of treatment options, and is a complaint that  involves a high risk of morbidity and mortality.  The Differential Diagnoses includes but is not exclusive to musculoskeletal back pain, renal colic, urinary tract infection, pyelonephritis, intra-abdominal causes of back pain, aortic aneurysm or dissection, cauda equina syndrome, sciatica, lumbar disc disease, thoracic disc disease, etc.   Critical Interventions-    Medications  ketorolac (TORADOL) 30 MG/ML injection 30 mg (30 mg Intramuscular Given 02/12/22 1527)    Reassessment after intervention:  Symptoms improved.   Clinical Laboratory Tests Ordered, included UA with many bacteria and trace leukocytes.  No significant UTI symptoms at this time.  Plan to send for culture.  Radiologic Tests Ordered, included CT renal. I independently interpreted the images and agree with radiology interpretation.   Cardiac Monitor Tracing which shows NSR.   Social Determinants of Health Risk no IVDA.    Medical Decision Making: Summary:  Presents emergency department of the lower back and left hip pain.  AVN of the left hip noted on CT without fracture.  No other acute finding on abdomen pelvis CT.  UA equivocal for UTI without significant symptoms plan to send this for culture and hold on treatment for now.  Reevaluation with update and discussion with patient. Plan for symptom mgmt at home and close PCP follow up.   Considered admission but pain well controlled here. Patient with ortho follow up in place. Discussed strict ED return precautions.   Disposition: discharge  ____________________________________________  FINAL CLINICAL IMPRESSION(S) / ED DIAGNOSES  Final diagnoses:  Left hip pain  Acute midline low back pain with left-sided sciatica     NEW OUTPATIENT MEDICATIONS STARTED DURING THIS VISIT:  Discharge Medication List as of 02/12/2022  4:02 PM     START taking these medications   Details  lidocaine (XYLOCAINE) 5 % ointment Apply 1 Application topically as needed.,  Starting Tue 02/12/2022, Normal        Note:  This document was prepared using Dragon voice recognition software and may include unintentional dictation errors.  Alona Bene, MD, [facility] Emergency Medicine    Trentyn Boisclair, Arlyss Repress, MD 02/18/22 [id number]

## 2022-02-12 NOTE — ED Notes (Signed)
Patient transported to CT 

## 2022-02-12 NOTE — ED Notes (Signed)
Discharge instructions, pain management, and follow up care reviewed and explained, pt verbalized understanding and had no further questions. Pt caox4 and ambulatory on d/c.

## 2022-02-12 NOTE — Discharge Instructions (Signed)
Seen the emergency room today with pain in your back and left hip.  You do have some avascular necrosis on CT which may be causing your symptoms.  Please either keep your appointment with your orthopedist on the eighth or I have given you the name of the on-call orthopedist to call and schedule an appointment as needed.  You may continue following with your pain management team at [facility] and I have called in some topical lidocaine to help with your pain symptoms as well.  Please return with any new or suddenly worsening symptoms.

## 2022-02-12 NOTE — ED Triage Notes (Signed)
Patient here POV from Home.  Possible Strained her Lower Back approximately 1-2 Weeks ago. Pain is Lower Back and Radiates Left Leg/Buttock.   Currently under Pain Management with [facility]. Takes Hydrocodone which is no longer effective and given Morphine but states the Side Effects were Severe and not effective.   Scheduled to see Orthopedics in 2-3 Weeks.   NAD Noted during Triage. A&Ox4. GCS 15. Ambulatory.

## 2022-02-14 LAB — URINE CULTURE: Culture: 60000 — AB

## 2022-02-15 ENCOUNTER — Telehealth (HOSPITAL_BASED_OUTPATIENT_CLINIC_OR_DEPARTMENT_OTHER): Payer: Self-pay | Admitting: *Deleted

## 2022-02-15 NOTE — Telephone Encounter (Signed)
Post ED Visit - Positive Culture Follow-up: Unsuccessful Patient Follow-up  Culture assessed and recommendations reviewed by:  []  Elenor Quinones, Pharm.D. []  Heide Guile, Pharm.D., BCPS AQ-ID []  Parks Neptune, Pharm.D., BCPS []  Alycia Rossetti, Pharm.D., BCPS []  [place], Pharm.D., BCPS, AAHIVP []  Legrand Como, Pharm.D., BCPS, AAHIVP []  Wynell Balloon, PharmD []  Vincenza Hews, PharmD, BCPS  Positive urine culture  [x]  Patient discharged without antimicrobial prescription and treatment is now indicated []  Organism is resistant to prescribed ED discharge antimicrobial []  Patient with positive blood cultures  Plan:  Cefpodoxime 200mg  po q12 hrs x 7 days, Charmaine Downs, PA-C  Unable to contact patient after 3 attempts, letter will be sent to address on file  Ardeen Fillers 02/15/2022, 1:26 PM

## 2022-02-15 NOTE — Progress Notes (Signed)
ED Antimicrobial Stewardship Positive Culture Follow Up   [name] is an 66 y.o. female who presented to [facility] on 02/12/2022 with a chief complaint of  Chief Complaint  Patient presents with   Back Pain    Recent Results (from the past 720 hour(s))  Urine Culture     Status: Abnormal   Collection Time: 02/12/22  3:25 PM   Specimen: Urine, Clean Catch  Result Value Ref Range Status   Specimen Description   Final    URINE, CLEAN CATCH Performed at [facility], [address] [place], [place] [address]    Special Requests   Final    NONE Performed at [facility], [address], [place], [place] [address]    Culture 60,000 COLONIES/mL KLEBSIELLA PNEUMONIAE (A)  Final   Report Status 02/14/2022 FINAL  Final   Organism ID, Bacteria KLEBSIELLA PNEUMONIAE (A)  Final      Susceptibility   Klebsiella pneumoniae - MIC*    AMPICILLIN RESISTANT Resistant     CEFAZOLIN <=4 SENSITIVE Sensitive     CEFEPIME <=0.12 SENSITIVE Sensitive     CEFTRIAXONE <=0.25 SENSITIVE Sensitive     CIPROFLOXACIN <=0.25 SENSITIVE Sensitive     GENTAMICIN <=1 SENSITIVE Sensitive     IMIPENEM <=0.25 SENSITIVE Sensitive     NITROFURANTOIN 64 INTERMEDIATE Intermediate     TRIMETH/SULFA <=20 SENSITIVE Sensitive     AMPICILLIN/SULBACTAM <=2 SENSITIVE Sensitive     PIP/TAZO <=4 SENSITIVE Sensitive     * 60,000 COLONIES/mL KLEBSIELLA PNEUMONIAE    [x]  Patient discharged originally without antimicrobial agent and treatment is now indicated  New antibiotic prescription: cefpodoxime 200mg  by mouth every 12 hours x 7 days  ED Provider: Charmaine Downs, PA-C   Kaleen Mask, PharmD, BCPS 02/15/2022, 11:20 AM Clinical Pharmacist Monday - Friday phone -  [phone] Saturday - Sunday phone - [phone]

## 2022-02-25 ENCOUNTER — Telehealth (HOSPITAL_BASED_OUTPATIENT_CLINIC_OR_DEPARTMENT_OTHER): Payer: Self-pay | Admitting: *Deleted

## 2022-02-25 NOTE — Telephone Encounter (Signed)
Post ED Visit - Positive Culture Follow-up: Successful Patient Follow-Up  Culture assessed and recommendations reviewed by:  [x]  Luisa Hart, Pharm.D. []  Heide Guile, Pharm.D., BCPS AQ-ID []  Parks Neptune, Pharm.D., BCPS []  Alycia Rossetti, Pharm.D., BCPS []  Thayer, [place].D., BCPS, AAHIVP []  Legrand Como, Pharm.D., BCPS, AAHIVP []  Salome Arnt, PharmD, BCPS []  Johnnette Gourd, PharmD, BCPS []  Hughes Better, PharmD, BCPS []  Leeroy Cha, PharmD  Positive urine culture  [x]  Patient discharged without antimicrobial prescription and treatment is now indicated []  Organism is resistant to prescribed ED discharge antimicrobial []  Patient with positive blood cultures  Changes discussed with ED provider: Charmaine Downs, PA New antibiotic prescription Cefpodoxime 200mg  PO q12hrs x 7 days. Called to [place], [place] patient, date 02/25/22, time 1516   Rosie Fate 02/25/2022, 3:17 PM

## 2022-02-27 ENCOUNTER — Ambulatory Visit: Payer: Medicare Other | Admitting: Orthopaedic Surgery

## 2022-03-19 ENCOUNTER — Ambulatory Visit: Payer: Medicare Other | Admitting: Orthopaedic Surgery

## 2022-10-01 ENCOUNTER — Ambulatory Visit: Payer: Medicare Other | Admitting: Internal Medicine

## 2022-10-01 ENCOUNTER — Other Ambulatory Visit: Payer: Self-pay

## 2022-10-01 ENCOUNTER — Encounter: Payer: Self-pay | Admitting: Internal Medicine

## 2022-10-01 VITALS — Resp 16 | Ht 69.0 in

## 2022-10-01 DIAGNOSIS — R062 Wheezing: Secondary | ICD-10-CM

## 2022-10-01 DIAGNOSIS — R0609 Other forms of dyspnea: Secondary | ICD-10-CM

## 2022-10-01 DIAGNOSIS — J3089 Other allergic rhinitis: Secondary | ICD-10-CM

## 2022-10-01 DIAGNOSIS — R053 Chronic cough: Secondary | ICD-10-CM | POA: Diagnosis not present

## 2022-10-01 MED ORDER — QVAR REDIHALER 80 MCG/ACT IN AERB: 2.0000 | INHALATION_SPRAY | Freq: Two times a day (BID) | RESPIRATORY_TRACT | 5 refills | Status: AC

## 2022-10-01 MED ORDER — FLUTICASONE PROPIONATE 50 MCG/ACT NA SUSP: 2.0000 | Freq: Every day | NASAL | 5 refills | Status: AC

## 2022-10-01 MED ORDER — ALBUTEROL SULFATE HFA 108 (90 BASE) MCG/ACT IN AERS: 1.0000 | INHALATION_SPRAY | Freq: Four times a day (QID) | RESPIRATORY_TRACT | 1 refills | Status: AC | PRN

## 2022-10-01 NOTE — Progress Notes (Signed)
NEW PATIENT  Date of Service/Encounter:  10/01/22  Consult requested by: April Manson, Kirby   Subjective:   [name] (DOB: 1955/09/29) is a 67 y.o. female who presents to the clinic on 10/01/2022 with a chief complaint of Asthma and Allergic Rhinitis  .    History obtained from: chart review and patient.   Shortness of breath/Cough/Wheezing:  No history of childhood asthma. Whenever she gets ill, she gets shortness of breath and wheezing followed by prolonged coughing. She was exposed to lots of pollution when she was younger. It is worse Spring and Fall.   Almost daily daytime symptoms in past month, sometimes nighttime awakenings in past month Using rescue inhaler: goes months without using it; more recently got sick and had flare up of allergies and needing it almost daily.  Limitations to daily activity: none Several times ED visits/UC visits and several times, almost every other month oral steroids in the past year 0 number of lifetime hospitalizations, 0 number of lifetime intubations.  Identified Triggers: respiratory illness Prior PFTs or spirometry: many previously; last one was last year but no records available  Previously used therapies: previously on Flovent  Current regimen:  Maintenance: none Rescue: Albuterol 2 puffs q4-6 hrs PRN. Last use was this morning.    History of prior COVID-19 infection: twice; has had worsening sinus symptoms  Smoking exposure: 1 ppd for 20 years, still socially smokes sometimes, quit 2008; has had 2 lung nodules that are monitored by Pulm.  Rhinitis:  Started almost 20+ years ago.  Symptoms include: nasal congestion, rhinorrhea, post nasal drainage, sneezing, watery eyes, and itchy eyes  Occurs seasonally-Spring/Fall Potential triggers: none  Treatments tried:  Previous allergy testing: yes; previously did AIT with [facility] about 20 years ago for about 2 years.  Flonase Claritin; last use was over 3 days  ago  History of reflux/heartburn: yes controlled with daily Protonix  History of sinus surgery: no Nonallergic triggers: none     Past Medical History: Past Medical History:  Diagnosis Date   Anxiety and depression    Asthma    related to seasonal allergies   Avascular necrosis of hip, right (HCC) 09/27/2016   AVN of femur (HCC) 07/30/2016   Diabetes mellitus with complication (HCC)    microalbuminuria 03/2015   GERD (gastroesophageal reflux disease)    Hyperlipidemia, mixed 07/2015   Hypertension    Vitamin D deficiency    Past Surgical History: Past Surgical History:  Procedure Laterality Date   FOOT SURGERY     INCONTINENCE SURGERY     SHOULDER SURGERY     TONSILLECTOMY     TOTAL HIP ARTHROPLASTY Right 09/27/2016   Procedure: RIGHT TOTAL HIP ARTHROPLASTY ANTERIOR APPROACH;  Surgeon: Kathryne Hitch, [name];  Location: [facility];  Service: Orthopedics;  Laterality: Right;    Family History: Family History  Problem Relation Age of Onset   Diabetes Mother    Dementia Mother    Dementia Father    Parkinson's disease Father     Social History:  Lives in a 100 year house Flooring in bedroom: carpet Pets: none Tobacco use/exposure: previous smoker  Job: retired  Medication List:  Allergies as of 10/01/2022       Reactions   Semaglutide Hives, Itching, Rash        Medication List        Accurate as of October 01, 2022  1:06 PM. If you have any questions, ask your nurse or doctor.  albuterol 108 (90 Base) MCG/ACT inhaler Commonly known as: VENTOLIN HFA Inhale 1-2 puffs into the lungs every 6 (six) hours as needed for wheezing or shortness of breath. What changed: how much to take Changed by: Christine Robson, [name]   ALPRAZolam 1 MG tablet Commonly known as: XANAX Take 1 mg by mouth 3 (three) times daily as needed for anxiety.   amLODipine 10 MG tablet Commonly known as: NORVASC Take 10 mg by mouth daily.   atenolol 50 MG tablet Commonly known  as: TENORMIN Take 50 mg by mouth daily.   atorvastatin 10 MG tablet Commonly known as: LIPITOR Take by mouth.   benzonatate 100 MG capsule Commonly known as: TESSALON Take 100 mg by mouth 3 (three) times daily as needed.   buPROPion ER 100 MG 12 hr tablet Commonly known as: WELLBUTRIN SR TAKE ONE TABLET BY MOUTH EVERY DAY   busPIRone 10 MG tablet Commonly known as: BUSPAR Take by mouth.   cephALEXin 500 MG capsule Commonly known as: KEFLEX Take 1 capsule (500 mg total) by mouth 3 (three) times daily.   Cholecalciferol 250 MCG (10000 UT) Caps Take by mouth.   cyclobenzaprine 10 MG tablet Commonly known as: FLEXERIL Take 1 tablet (10 mg total) by mouth 3 (three) times daily as needed for muscle spasms.   cycloSPORINE 0.05 % ophthalmic emulsion Commonly known as: RESTASIS Place 1 drop into both eyes 2 (two) times daily.   DHEA 50 MG Caps Take 1 capsule by mouth daily.   doxycycline 100 MG tablet Commonly known as: VIBRA-TABS Take 100 mg by mouth 2 (two) times daily.   DULoxetine 30 MG capsule Commonly known as: CYMBALTA Take 1 capsule (30 mg total) by mouth 2 (two) times daily.   empagliflozin 10 MG Tabs tablet Commonly known as: JARDIANCE Take by mouth.   fenofibrate micronized 67 MG capsule Commonly known as: LOFIBRA Take by mouth.   fexofenadine 180 MG tablet Commonly known as: ALLEGRA Take by mouth.   fluticasone 50 MCG/ACT nasal spray Commonly known as: FLONASE Place 2 sprays into both nostrils daily. What changed:  when to take this reasons to take this Changed by: Christine Robson, [name]   gemfibrozil 600 MG tablet Commonly known as: LOPID Take 1 tablet every AM and 1 tablet every PM. What changed:  how much to take how to take this when to take this additional instructions   HYDROcodone-acetaminophen 10-325 MG tablet Commonly known as: NORCO Take 1-2 tablets by mouth 2 (two) times daily as needed for moderate pain.   levofloxacin 250 MG  tablet Commonly known as: LEVAQUIN Take 125 mg by mouth 2 (two) times daily.   lidocaine 5 % ointment Commonly known as: XYLOCAINE Apply 1 Application topically as needed.   losartan 50 MG tablet Commonly known as: COZAAR Take 50 mg by mouth daily.   metFORMIN 500 MG tablet Commonly known as: GLUCOPHAGE Take 500 mg by mouth daily with breakfast.   naloxone 4 MG/0.1ML Liqd nasal spray kit Commonly known as: NARCAN   omeprazole 20 MG capsule Commonly known as: PRILOSEC Take 20 mg by mouth daily.   ondansetron 4 MG disintegrating tablet Commonly known as: Zofran ODT 4mg  ODT q4 hours prn nausea/vomit   promethazine-dextromethorphan 6.25-15 MG/5ML syrup Commonly known as: PROMETHAZINE-DM Take 5 mLs by mouth 4 (four) times daily as needed.   QUEtiapine 100 MG tablet Commonly known as: SEROQUEL Take 50 mg by mouth at bedtime.   Qvar RediHaler 80 MCG/ACT inhaler Generic drug:  beclomethasone Inhale 2 puffs into the lungs 2 (two) times daily. Started by: Christine Robson, [name]   Rybelsus 3 MG Tabs Generic drug: Semaglutide Take 1 tablet by mouth daily.   sertraline 100 MG tablet Commonly known as: ZOLOFT Take 100 mg by mouth daily.   triamcinolone cream 0.1 % Commonly known as: KENALOG Apply topically 2 (two) times daily as needed.   TURMERIC PO Take 250 mg by mouth daily as needed (pain).   vitamin A 3 MG (10000 UNITS) capsule Take by mouth.   Vitamin D (Ergocalciferol) 1.25 MG (50000 UNIT) Caps capsule Commonly known as: DRISDOL Take 1 capsule by mouth once a week.   zolpidem 10 MG tablet Commonly known as: AMBIEN Take by mouth.         REVIEW OF SYSTEMS: Pertinent positives and negatives discussed in HPI.   Objective:   Physical Exam: Resp 16   Ht 5\' 9"  (1.753 m)   BMI 27.31 kg/m  Body mass index is 27.31 kg/m. GEN: alert, well developed HEENT: clear conjunctiva, TM grey and translucent, nose with + inferior turbinate hypertrophy, pink nasal  mucosa, slight clear rhinorrhea, + cobblestoning HEART: regular rate and rhythm, no murmur LUNGS: clear to auscultation bilaterally, no coughing, unlabored respiration ABDOMEN: soft, non distended  SKIN: no rashes or lesions  Reviewed:  09/02/2022: seen by [name] for cough, nasal congestion.  Reported it is typical of acute on chronic bronchitis.  On albuterol nebs. Noted to have wheezing on exam. Started on azithromycin.   04/26/2022: seen by [name] for COPD?Marland Kitchen  Reports fatigue and cough every since COVID.  Planning to see Pulm.   02/05/2022: seen by [name] for cough, lung nodules, allergic rhinitis, former smoker. Stable lung nodules with plan for repeat CT every 6 months. On Nasonex, previously AIT.  Planning for PFT at next visit.    Spirometry:  Tracings reviewed. Her effort: Good reproducible efforts. FVC: 2.52L, 68%; post 2.70, 73% FEV1: 1.83L, 64% predicted; post 1.87, 66% FEV1/FVC ratio: 73% Interpretation: Spirometry consistent with mixed obstructive and restrictive disease.  Please see scanned spirometry results for details.  Assessment:   1. Chronic cough   2. Wheezing   3. Other allergic rhinitis   4. Other form of dyspnea     Plan/Recommendations:   Chronic Cough, Dyspnea, Wheezing - Spirometry without obstruction by ratio but low FEV1, FVC with low FEF25-75, concerning for possible obstructive + restrictive lung disease; no significant reversibility. Discussed possibility of COPD in setting of prior hx of smoking.  She is followed by [name] for lung nodules.  If she has persistent symptoms, will need to follow up with them for Full PFT.  I am not completely convinced this is asthma as she has no reversibility on spirometry today but will do a trial of ICS.   - Maintenance inhaler: start Qvar 2 puffs twice daily. - Rescue inhaler: Albuterol 2 puffs via spacer or 1 vial via nebulizer every 4-6 hours as needed for respiratory symptoms of cough, shortness of  breath, or wheezing Control goals:  Full participation in all desired activities (may need albuterol before activity) Albuterol use two times or less a week on average (not counting use with activity) Cough interfering with sleep two times or less a month Oral steroids no more than once a year No hospitalizations   Chronic Rhinitis: - Due to turbinate hypertrophy, seasonal symptoms and unresponsive to OTC meds, will perform skin testing to identify aeroallergen triggers.  Unable to  do SPT today due to [facility].   - Use nasal saline rinses before nose sprays such as with Neilmed Sinus Rinse.  Use distilled water.   - Use Flonase 2 sprays each nostril daily. Aim upward and outward. - Hold all anti histamines 3 days prior to next visit so starting today. We will do skin testing at the next visit aeroallergens 1-55.   Return in about 3 days (around 10/04/2022).  Christine Morin, [name] [facility]

## 2022-10-01 NOTE — Patient Instructions (Addendum)
[name] Return in about 3 days (around 10/04/2022).   Asthma: - Maintenance inhaler: start Qvar 2 puffs twice daily.  - Rescue inhaler: Albuterol 2 puffs via spacer or 1 vial via nebulizer every 4-6 hours as needed for respiratory symptoms of cough, shortness of breath, or wheezing Asthma control goals:  Full participation in all desired activities (may need albuterol before activity) Albuterol use two times or less a week on average (not counting use with activity) Cough interfering with sleep two times or less a month Oral steroids no more than once a year No hospitalizations   Chronic Rhinitis: - Due to turbinate hypertrophy, seasonal symptoms and unresponsive to OTC meds, performed skin testing to identify aeroallergen triggers.   - Use nasal saline rinses before nose sprays such as with Neilmed Sinus Rinse.  Use distilled water.   - Use Flonase 2 sprays each nostril daily. Aim upward and outward. - Hold all anti histamines 3 days prior to next visit so starting today. We will do skin testing at the next visit.   Follow up: Friday 10/04/2022 at [facility] with me for skin testing at 9 AM  [address] [place], [place], [place] [address]

## 2022-10-04 ENCOUNTER — Ambulatory Visit: Payer: Medicare Other | Admitting: Internal Medicine

## 2022-10-04 ENCOUNTER — Other Ambulatory Visit: Payer: Self-pay

## 2022-10-04 ENCOUNTER — Encounter: Payer: Self-pay | Admitting: Internal Medicine

## 2022-10-04 VITALS — BP 110/60 | HR 73 | Temp 98.1°F | Resp 16 | Ht 69.5 in | Wt 184.7 lb

## 2022-10-04 DIAGNOSIS — J301 Allergic rhinitis due to pollen: Secondary | ICD-10-CM

## 2022-10-04 MED ORDER — CETIRIZINE HCL 10 MG PO TABS: 10.0000 mg | ORAL_TABLET | Freq: Every day | ORAL | 5 refills | Status: AC

## 2022-10-04 MED ORDER — AZELASTINE HCL 0.1 % NA SOLN: 1.0000 | Freq: Two times a day (BID) | NASAL | 5 refills | Status: AC | PRN

## 2022-10-04 NOTE — Patient Instructions (Addendum)
[name] Return in about 6 weeks (around 11/15/2022).   Cough/Wheezing/SOB: - Maintenance inhaler: continue Qvar 2 puffs twice daily.  - Rescue inhaler: Albuterol 2 puffs via spacer or 1 vial via nebulizer every 4-6 hours as needed for respiratory symptoms of cough, shortness of breath, or wheezing Control goals:  Full participation in all desired activities (may need albuterol before activity) Albuterol use two times or less a week on average (not counting use with activity) Cough interfering with sleep two times or less a month Oral steroids no more than once a year No hospitalizations   Allergic Rhinitis:  - Positive skin test 09/2022: grasses, trees - Avoidance measures discussed. - Use nasal saline rinses before nose sprays such as with Neilmed Sinus Rinse.  Use distilled water.   - Use Flonase 2 sprays each nostril daily. Aim upward and outward. - Use Azelastine 1-2 sprays each nostril twice daily as needed. Aim upward and outward. - Use Zyrtec 10 mg daily.  - Consider allergy shots as long term control of your symptoms by teaching your immune system to be more tolerant of your allergy triggers

## 2022-10-04 NOTE — Progress Notes (Signed)
FOLLOW UP Date of Service/Encounter:  10/04/22   Subjective:  [name] (DOB: Aug 07, 1955) is a 67 y.o. female [facility] for follow up for skin testing.  History obtained from: chart review and patient. Held anti histamines for this visit. Not sick  Past Medical History: Past Medical History:  Diagnosis Date   Anxiety and depression    Asthma    related to seasonal allergies   Avascular necrosis of hip, right (HCC) 09/27/2016   AVN of femur (HCC) 07/30/2016   Diabetes mellitus with complication (HCC)    microalbuminuria 03/2015   GERD (gastroesophageal reflux disease)    Hyperlipidemia, mixed 07/2015   Hypertension    Vitamin D deficiency     Objective:  BP 110/60 (BP Location: Right Arm, Patient Position: Sitting, Cuff Size: Normal)   Pulse 73   Temp 98.1 F (36.7 C) (Temporal)   Resp 16   Ht 5' 9.5" (1.765 m)   Wt 184 lb 11.2 oz (83.8 kg)   SpO2 95%   BMI 26.88 kg/m  Body mass index is 26.88 kg/m. Physical Exam: GEN: alert, well developed HEENT: clear conjunctiva, MMM HEART: regular rate  LUNGS: no coughing, unlabored respiration SKIN: no rashes or lesions  Skin Testing:  Skin prick testing was placed, which includes aeroallergens/foods, histamine control, and saline control.  Verbal consent was obtained prior to placing test.  Patient tolerated procedure well.  Allergy testing results were read and interpreted by myself, documented by clinical staff. Adequate positive and negative control.  Positive results to:  Results discussed with patient/family.  Airborne Adult Perc - 10/04/22 1331     Time Antigen Placed 1331    Allergen Manufacturer Waynette Buttery    Location Back    Number of Test 55    Panel 1 Select    2. Control-Histamine 3+    3. Bahia Negative    4. French Southern Territories Negative    5. Johnson Negative    6. [place] Blue Negative    7. Meadow Fescue 3+    8. Perennial Rye 3+    9. Timothy 3+    10.  Ragweed Mix Negative    11. Cocklebur Negative    12. Plantain,  English Negative    13. Baccharis Negative    14. Dog Fennel Negative    15. Russian Thistle Negative    16. Lamb's Quarters Negative    17. Sheep Sorrell Negative    18. Rough Pigweed Negative    19. Marsh Elder, Rough Negative    20. Mugwort, Common Negative    21. Box, Elder 2+    22. Cedar, red 3+    23. Sweet Gum Negative    24. Pecan Pollen Negative    25. Pine Mix Negative    26. Walnut, Black Pollen Negative    27. Red Mulberry Negative    28. Ash Mix Negative    29. Birch Mix Negative    30. Beech American Negative    31. Cottonwood, Guinea-Bissau Negative    32. Hickory, White Negative    33. Maple Mix Negative    34. Oak, Guinea-Bissau Mix Negative    35. Sycamore Eastern Negative    36. Alternaria Alternata Negative    37. Cladosporium Herbarum Negative    38. Aspergillus Mix Negative    39. Penicillium Mix Negative    40. Bipolaris Sorokiniana (Helminthosporium) Negative    41. Drechslera Spicifera (Curvularia) Negative  43. Fusarium Moniliforme Negative    44. Aureobasidium Pullulans (pullulara) Negative    45. Rhizopus Oryzae Negative    46. Botrytis Cinera Negative    47. Epicoccum Nigrum Negative    48. Phoma Betae Negative    49. Dust Mite Mix Negative    50. Cat Hair 10,000 BAU/ml Negative    51.  Dog Epithelia Negative    52. Mixed Feathers Negative    53. Horse Epithelia Negative    54. Cockroach, German Negative    55. Tobacco Leaf Negative             Intradermal - 10/04/22 1430     Time Antigen Placed 1430    Allergen Manufacturer Waynette Buttery    Location Arm    Number of Test 15    Intradermal Select    Control Negative    Bahia Negative    French Southern Territories Negative    Johnson Negative    Ragweed Mix Negative    Weed Mix Negative    Tree Mix Negative    Mold 1 Negative    Mold 2 Negative    Mold 3 Negative    Mold 4 Negative    Mite Mix Negative    Cat Negative    Dog Negative     Cockroach Negative              Assessment:   1. Non-seasonal allergic rhinitis due to pollen     Plan/Recommendations:   Cough/Wheezing/SOB: - Spirometry without obstruction by ratio but low FEV1, FVC with low FEF25-75, concerning for possible obstructive + restrictive lung disease; no significant reversibility. Discussed possibility of COPD in setting of prior hx of smoking.  She is followed by [facility] for lung nodules.  If she has persistent symptoms, will need to follow up with them for Full PFT.  I am not completely convinced this is asthma as she has no reversibility on spirometry at last visit but will do a trial of ICS.   - Maintenance inhaler: continue Qvar 2 puffs twice daily.  - Rescue inhaler: Albuterol 2 puffs via spacer or 1 vial via nebulizer every 4-6 hours as needed for respiratory symptoms of cough, shortness of breath, or wheezing Control goals:  Full participation in all desired activities (may need albuterol before activity) Albuterol use two times or less a week on average (not counting use with activity) Cough interfering with sleep two times or less a month Oral steroids no more than once a year No hospitalizations   Allergic Rhinitis: - Due to turbinate hypertrophy and unresponsive to OTC meds, performed skin testing to identify aeroallergen triggers.   - Positive skin test 09/2022: grasses, trees - Avoidance measures discussed. - Use nasal saline rinses before nose sprays such as with Neilmed Sinus Rinse.  Use distilled water.   - Use Flonase 2 sprays each nostril daily. Aim upward and outward. - Use Azelastine 1-2 sprays each nostril twice daily as needed. Aim upward and outward. - Use Zyrtec 10 mg daily.  - Consider allergy shots as long term control of your symptoms by teaching your immune system to be more tolerant of your allergy triggers   Return in about 6 weeks (around 11/15/2022).  Alesia Morin, MD [facility]

## 2024-02-07 ENCOUNTER — Emergency Department (HOSPITAL_BASED_OUTPATIENT_CLINIC_OR_DEPARTMENT_OTHER): Admitting: Radiology

## 2024-02-07 ENCOUNTER — Emergency Department (HOSPITAL_BASED_OUTPATIENT_CLINIC_OR_DEPARTMENT_OTHER)
Admission: EM | Admit: 2024-02-07 | Discharge: 2024-02-07 | Disposition: A | Discharge status: Home / Self Care | Attending: Emergency Medicine | Admitting: Emergency Medicine

## 2024-02-07 DIAGNOSIS — W228XXA Striking against or struck by other objects, initial encounter: Secondary | ICD-10-CM | POA: Insufficient documentation

## 2024-02-07 DIAGNOSIS — S92515A Nondisplaced fracture of proximal phalanx of left lesser toe(s), initial encounter for closed fracture: Secondary | ICD-10-CM | POA: Diagnosis not present

## 2024-02-07 DIAGNOSIS — Z5321 Procedure and treatment not carried out due to patient leaving prior to being seen by health care provider: Secondary | ICD-10-CM | POA: Insufficient documentation

## 2024-02-07 DIAGNOSIS — S99922A Unspecified injury of left foot, initial encounter: Secondary | ICD-10-CM | POA: Diagnosis present

## 2024-02-07 MED ORDER — HYDROCODONE-ACETAMINOPHEN 5-325 MG PO TABS
1.0000 | ORAL_TABLET | Freq: Once | ORAL | Status: AC
  Administered 2024-02-07: 1 via ORAL
  Filled 2024-02-07: qty 1

## 2024-02-07 NOTE — ED Triage Notes (Signed)
 Pt stubbed left 4th toe on nightstand last night and reports pain and swelling since.  4th toe visibly bruised with raised knot at proximal knuckle and pt reports it looks a little more crooked than normal.

## 2024-02-07 NOTE — ED Provider Notes (Signed)
  This patient walked out of the emergency department before my formal evaluation.  I did not evaluate the patient.       Jerrol Agent, MD 02/07/24 1324
